# Patient Record
Sex: Female | Born: 1935 | Race: Black or African American | Hispanic: No | Marital: Married | Smoking: Former smoker
Health system: Southern US, Community
[De-identification: ages and names within clinical notes are randomized; demographics above are authoritative.]

## PROBLEM LIST (undated history)

## (undated) DIAGNOSIS — M1990 Unspecified osteoarthritis, unspecified site: Secondary | ICD-10-CM

## (undated) DIAGNOSIS — I10 Essential (primary) hypertension: Secondary | ICD-10-CM

## (undated) DIAGNOSIS — E785 Hyperlipidemia, unspecified: Secondary | ICD-10-CM

## (undated) HISTORY — DX: Essential (primary) hypertension: I10

## (undated) HISTORY — DX: Unspecified osteoarthritis, unspecified site: M19.90

## (undated) HISTORY — DX: Hyperlipidemia, unspecified: E78.5

---

## 1998-07-01 ENCOUNTER — Ambulatory Visit (HOSPITAL_COMMUNITY): Admission: RE | Admit: 1998-07-01 | Discharge: 1998-07-01 | Payer: Self-pay | Admitting: Family Medicine

## 1998-07-01 ENCOUNTER — Encounter: Payer: Self-pay | Admitting: Family Medicine

## 1999-01-05 ENCOUNTER — Ambulatory Visit (HOSPITAL_COMMUNITY): Admission: RE | Admit: 1999-01-05 | Discharge: 1999-01-05 | Payer: Self-pay | Admitting: Family Medicine

## 1999-01-05 ENCOUNTER — Encounter: Payer: Self-pay | Admitting: Family Medicine

## 1999-08-24 ENCOUNTER — Other Ambulatory Visit: Admission: RE | Admit: 1999-08-24 | Discharge: 1999-08-24 | Payer: Self-pay | Admitting: Family Medicine

## 2001-07-14 ENCOUNTER — Ambulatory Visit (HOSPITAL_BASED_OUTPATIENT_CLINIC_OR_DEPARTMENT_OTHER): Admission: RE | Admit: 2001-07-14 | Discharge: 2001-07-14 | Payer: Self-pay | Admitting: General Surgery

## 2001-07-14 ENCOUNTER — Encounter (INDEPENDENT_AMBULATORY_CARE_PROVIDER_SITE_OTHER): Payer: Self-pay | Admitting: *Deleted

## 2008-08-09 ENCOUNTER — Ambulatory Visit: Payer: Self-pay | Admitting: Family Medicine

## 2008-08-16 ENCOUNTER — Ambulatory Visit: Payer: Self-pay | Admitting: Family Medicine

## 2008-09-16 ENCOUNTER — Encounter: Admission: RE | Admit: 2008-09-16 | Discharge: 2008-09-16 | Payer: Self-pay | Admitting: Family Medicine

## 2008-09-22 ENCOUNTER — Ambulatory Visit: Payer: Self-pay | Admitting: Family Medicine

## 2008-09-23 ENCOUNTER — Telehealth: Payer: Self-pay | Admitting: Gastroenterology

## 2008-09-29 DIAGNOSIS — R6881 Early satiety: Secondary | ICD-10-CM

## 2008-09-29 DIAGNOSIS — R634 Abnormal weight loss: Secondary | ICD-10-CM

## 2008-09-29 DIAGNOSIS — R630 Anorexia: Secondary | ICD-10-CM | POA: Insufficient documentation

## 2008-09-29 DIAGNOSIS — D649 Anemia, unspecified: Secondary | ICD-10-CM | POA: Insufficient documentation

## 2008-10-26 ENCOUNTER — Ambulatory Visit: Payer: Self-pay | Admitting: Family Medicine

## 2008-10-26 ENCOUNTER — Encounter: Payer: Self-pay | Admitting: Gastroenterology

## 2008-11-05 ENCOUNTER — Observation Stay (HOSPITAL_COMMUNITY): Admission: EM | Admit: 2008-11-05 | Discharge: 2008-11-06 | Payer: Self-pay | Admitting: Emergency Medicine

## 2009-05-12 ENCOUNTER — Ambulatory Visit: Payer: Self-pay | Admitting: Family Medicine

## 2009-07-27 ENCOUNTER — Ambulatory Visit: Payer: Self-pay | Admitting: Family Medicine

## 2009-09-05 ENCOUNTER — Ambulatory Visit: Payer: Self-pay | Admitting: Family Medicine

## 2009-10-06 ENCOUNTER — Ambulatory Visit: Payer: Self-pay | Admitting: Family Medicine

## 2009-11-08 ENCOUNTER — Ambulatory Visit: Payer: Self-pay | Admitting: Family Medicine

## 2009-11-29 ENCOUNTER — Ambulatory Visit: Payer: Self-pay | Admitting: Family Medicine

## 2010-01-09 ENCOUNTER — Ambulatory Visit: Payer: Self-pay | Admitting: Family Medicine

## 2010-04-13 ENCOUNTER — Ambulatory Visit: Payer: Self-pay | Admitting: Family Medicine

## 2010-05-11 ENCOUNTER — Ambulatory Visit: Payer: Self-pay | Admitting: Family Medicine

## 2010-10-07 ENCOUNTER — Encounter: Payer: Self-pay | Admitting: Psychiatry

## 2010-11-17 ENCOUNTER — Other Ambulatory Visit (HOSPITAL_COMMUNITY): Payer: Self-pay | Admitting: Psychiatry

## 2010-11-17 DIAGNOSIS — R410 Disorientation, unspecified: Secondary | ICD-10-CM

## 2010-11-17 DIAGNOSIS — R413 Other amnesia: Secondary | ICD-10-CM

## 2010-12-25 ENCOUNTER — Ambulatory Visit (HOSPITAL_COMMUNITY)
Admission: RE | Admit: 2010-12-25 | Discharge: 2010-12-25 | Disposition: A | Discharge status: Home / Self Care | Payer: Medicare Other | Source: Ambulatory Visit | Attending: Psychiatry | Admitting: Psychiatry

## 2010-12-25 DIAGNOSIS — G319 Degenerative disease of nervous system, unspecified: Secondary | ICD-10-CM | POA: Insufficient documentation

## 2010-12-25 DIAGNOSIS — R410 Disorientation, unspecified: Secondary | ICD-10-CM

## 2010-12-25 DIAGNOSIS — F29 Unspecified psychosis not due to a substance or known physiological condition: Secondary | ICD-10-CM | POA: Insufficient documentation

## 2010-12-25 DIAGNOSIS — R413 Other amnesia: Secondary | ICD-10-CM | POA: Insufficient documentation

## 2010-12-25 DIAGNOSIS — I6789 Other cerebrovascular disease: Secondary | ICD-10-CM | POA: Insufficient documentation

## 2011-01-02 LAB — CBC
HCT: 29.9 % — ABNORMAL LOW (ref 36.0–46.0)
Hemoglobin: 10.2 g/dL — ABNORMAL LOW (ref 12.0–15.0)
MCHC: 34 g/dL (ref 30.0–36.0)
MCV: 89.1 fL (ref 78.0–100.0)
Platelets: 212 10*3/uL (ref 150–400)
RBC: 3.36 MIL/uL — ABNORMAL LOW (ref 3.87–5.11)
RDW: 17.6 % — ABNORMAL HIGH (ref 11.5–15.5)
WBC: 5.3 10*3/uL (ref 4.0–10.5)

## 2011-01-02 LAB — URINE MICROSCOPIC-ADD ON

## 2011-01-02 LAB — DIFFERENTIAL
Lymphocytes Relative: 21 % (ref 12–46)
Lymphs Abs: 1.1 10*3/uL (ref 0.7–4.0)
Monocytes Absolute: 0.6 10*3/uL (ref 0.1–1.0)
Monocytes Relative: 12 % (ref 3–12)
Neutro Abs: 3.6 10*3/uL (ref 1.7–7.7)
Neutrophils Relative %: 67 % (ref 43–77)

## 2011-01-02 LAB — URINALYSIS, ROUTINE W REFLEX MICROSCOPIC
Bilirubin Urine: NEGATIVE
Hgb urine dipstick: NEGATIVE
Specific Gravity, Urine: 1.007 (ref 1.005–1.030)
pH: 7 (ref 5.0–8.0)

## 2011-01-02 LAB — GLUCOSE, CAPILLARY
Glucose-Capillary: 121 mg/dL — ABNORMAL HIGH (ref 70–99)
Glucose-Capillary: 142 mg/dL — ABNORMAL HIGH (ref 70–99)
Glucose-Capillary: 153 mg/dL — ABNORMAL HIGH (ref 70–99)
Glucose-Capillary: 252 mg/dL — ABNORMAL HIGH (ref 70–99)

## 2011-01-02 LAB — URINE CULTURE: Colony Count: 50000

## 2011-01-02 LAB — COMPREHENSIVE METABOLIC PANEL
Albumin: 3.2 g/dL — ABNORMAL LOW (ref 3.5–5.2)
BUN: 9 mg/dL (ref 6–23)
Calcium: 8.8 mg/dL (ref 8.4–10.5)
Creatinine, Ser: 0.77 mg/dL (ref 0.4–1.2)
Glucose, Bld: 144 mg/dL — ABNORMAL HIGH (ref 70–99)
Potassium: 3 mEq/L — ABNORMAL LOW (ref 3.5–5.1)
Total Protein: 6.2 g/dL (ref 6.0–8.3)

## 2011-02-02 NOTE — Op Note (Signed)
[place]. [facility]  Patient:    ADER, FRITZE Visit Number: [id number] MRN: [id number]          Service Type: DSU Location: [facility] Attending Physician:  Janalyn Rouse Dictated by:   Tricarico Phi. Maple Hudson, M.D. Proc. Date: 07/14/01 Admit Date:  07/14/2001   CC:         Jeralyn Ruths, M.D.   Operative Report  PREOPERATIVE DIAGNOSIS:  Intraductal papilloma of the left breast.  POSTOPERATIVE DIAGNOSIS:  Intraductal papilloma of the left breast.  OPERATION PERFORMED:  Excision of duct system of the left breast.  SURGEON:  Heckart Phi. Maple Hudson, M.D.  ANESTHESIA:  MAC.  INDICATIONS FOR PROCEDURE:  This patient had had a draining from the 8 and 6 oclock position and had ductograms done which showed an intraductal papilloma and the duct coming to the 8 oclock position of the left breast and ductal ectasia going to the 6 oclock position.  We are to excise those.  DESCRIPTION OF PROCEDURE:  The patient was placed on the operating table with the left arm extended.  The left breast was prepped and draped in the usual fashion.  A circumareolar incision centered on about the 7 oclock position was then outlined on the left breast and the area infiltrated with 1% Xylocaine with Adrenalin.  An incision was made and then I dissected underneath that time the areola up under the nipple and then took a large wedge of tissue incorporating the 6 to 8 oclock positions.  Hemostasis was obtained with th4e cautery.  Subcuticular closure of 4-0 Monocryl and Steri-Strips carried out.  Dressing applied.  The patient was transferred to the recovery room in satisfactory condition having tolerated the procedure well. Dictated by:   [place]on Phi. Maple Hudson, M.D. Attending Physician:  Janalyn Rouse DD:  07/14/01 TD:  07/14/01 Job: [id number]

## 2011-06-28 ENCOUNTER — Ambulatory Visit (INDEPENDENT_AMBULATORY_CARE_PROVIDER_SITE_OTHER): Payer: Medicare Other | Admitting: Family Medicine

## 2011-06-28 ENCOUNTER — Encounter: Payer: Self-pay | Admitting: Family Medicine

## 2011-06-28 DIAGNOSIS — Z79899 Other long term (current) drug therapy: Secondary | ICD-10-CM

## 2011-06-28 DIAGNOSIS — Z23 Encounter for immunization: Secondary | ICD-10-CM

## 2011-06-28 DIAGNOSIS — E1169 Type 2 diabetes mellitus with other specified complication: Secondary | ICD-10-CM

## 2011-06-28 DIAGNOSIS — I10 Essential (primary) hypertension: Secondary | ICD-10-CM

## 2011-06-28 DIAGNOSIS — E119 Type 2 diabetes mellitus without complications: Secondary | ICD-10-CM

## 2011-06-28 DIAGNOSIS — R292 Abnormal reflex: Secondary | ICD-10-CM

## 2011-06-28 DIAGNOSIS — E785 Hyperlipidemia, unspecified: Secondary | ICD-10-CM

## 2011-06-28 DIAGNOSIS — F0280 Dementia in other diseases classified elsewhere without behavioral disturbance: Secondary | ICD-10-CM

## 2011-06-28 LAB — LIPID PANEL
HDL: 72 mg/dL (ref >39)
LDL Cholesterol: 162 mg/dL — ABNORMAL HIGH (ref 0–99)

## 2011-06-28 LAB — POCT UA - MICROALBUMIN
Albumin/Creatinine Ratio, Urine, POC: 17.2
Creatinine, POC: 162 mg/dL
Microalbumin Ur, POC: 27.8 mg/dL

## 2011-06-28 LAB — CBC WITH DIFFERENTIAL/PLATELET
Basophils Absolute: 0 10*3/uL (ref 0.0–0.1)
Basophils Relative: 1 % (ref 0–1)
HCT: 35.7 % — ABNORMAL LOW (ref 36.0–46.0)
Hemoglobin: 11.7 g/dL — ABNORMAL LOW (ref 12.0–15.0)
Lymphocytes Relative: 50 % — ABNORMAL HIGH (ref 12–46)
MCHC: 32.8 g/dL (ref 30.0–36.0)
Monocytes Absolute: 0.4 10*3/uL (ref 0.1–1.0)
Neutro Abs: 1.1 10*3/uL — ABNORMAL LOW (ref 1.7–7.7)
Neutrophils Relative %: 35 % — ABNORMAL LOW (ref 43–77)
RDW: 15.8 % — ABNORMAL HIGH (ref 11.5–15.5)
WBC: 3.1 10*3/uL — ABNORMAL LOW (ref 4.0–10.5)

## 2011-06-28 LAB — COMPREHENSIVE METABOLIC PANEL
ALT: 9 U/L (ref 0–35)
AST: 17 U/L (ref 0–37)
Albumin: 4.2 g/dL (ref 3.5–5.2)
Alkaline Phosphatase: 63 U/L (ref 39–117)
Calcium: 9 mg/dL (ref 8.4–10.5)
Chloride: 103 mEq/L (ref 96–112)
Potassium: 3.8 mEq/L (ref 3.5–5.3)
Sodium: 142 mEq/L (ref 135–145)
Total Protein: 6.9 g/dL (ref 6.0–8.3)

## 2011-06-28 LAB — TSH: TSH: 1.194 u[IU]/mL (ref 0.350–4.500)

## 2011-06-28 LAB — POCT GLYCOSYLATED HEMOGLOBIN (HGB A1C): Hemoglobin A1C: 5.9

## 2011-06-28 MED ORDER — LISINOPRIL-HYDROCHLOROTHIAZIDE 20-12.5 MG PO TABS: 1.0000 | ORAL_TABLET | Freq: Every day | ORAL | Status: DC

## 2011-06-28 MED ORDER — METFORMIN HCL 850 MG PO TABS: 850.0000 mg | ORAL_TABLET | Freq: Two times a day (BID) | ORAL | Status: DC

## 2011-06-28 NOTE — Progress Notes (Signed)
  Subjective:    Patient ID: Beth Kelly, female    DOB: 05-21-1934, 77 y.o.   MRN: [id number]  HPI She is here in one year absence. She is on metformin and lisinopril however she is not taking her Pravachol. Also apparently she saw neurologist and was placed on an unknown medication and presently is using the patch she finds quite irritating. She lives alone. Her son is to keep in touch with her fairly frequently.   Review of Systems     Objective:   Physical Exam Alert and in no distress. Cardiac exam shows regular rhythm without murmurs or gallops. Lungs are clear to auscultation. Foot exam shows decreased pulses with lack of MMSE 27      Assessment & Plan:   1. Diabetes mellitus  metFORMIN (GLUCOPHAGE) 850 MG tablet, Ambulatory referral to Ophthalmology, CBC with Differential, Comprehensive metabolic panel, Lipid panel, POCT UA - Microalbumin, POCT HgB A1C  2. Hypertension associated with diabetes  lisinopril-hydrochlorothiazide (ZESTORETIC) 20-12.5 MG per tablet  3. Hyperlipidemia LDL goal <70    4. Alzheimer's disease    5. Encounter for long-term (current) use of other medications  CBC with Differential, Comprehensive metabolic panel, Lipid panel  6. Hyperreflexia  TSH   she will sign a release form to find out what medication she was on from the neurologist. Her medications were called in. She'll return here in one month for recheck on her blood pressure.

## 2011-06-28 NOTE — Patient Instructions (Signed)
Take all 4 of your medications including the patch until I get your records and I will call you. Come back here in one month for recheck on your blood pressure.

## 2011-06-29 ENCOUNTER — Other Ambulatory Visit: Payer: Self-pay

## 2011-06-29 ENCOUNTER — Telehealth: Payer: Self-pay

## 2011-06-29 MED ORDER — PRAVASTATIN SODIUM 20 MG PO TABS: 20.0000 mg | ORAL_TABLET | Freq: Every evening | ORAL | Status: DC

## 2011-06-29 NOTE — Telephone Encounter (Signed)
Pt son Chrissie Noa Plocher called and said he was told you wanted to talk with him and also he would like to know more about her visit yesterday his telephone # is [phone]

## 2011-07-30 ENCOUNTER — Encounter: Payer: Self-pay | Admitting: Family Medicine

## 2011-07-31 ENCOUNTER — Ambulatory Visit (INDEPENDENT_AMBULATORY_CARE_PROVIDER_SITE_OTHER): Payer: Medicare Other | Admitting: Family Medicine

## 2011-07-31 ENCOUNTER — Encounter: Payer: Self-pay | Admitting: Family Medicine

## 2011-07-31 DIAGNOSIS — E785 Hyperlipidemia, unspecified: Secondary | ICD-10-CM

## 2011-07-31 DIAGNOSIS — E1169 Type 2 diabetes mellitus with other specified complication: Secondary | ICD-10-CM

## 2011-07-31 DIAGNOSIS — I10 Essential (primary) hypertension: Secondary | ICD-10-CM

## 2011-07-31 DIAGNOSIS — Z9119 Patient's noncompliance with other medical treatment and regimen: Secondary | ICD-10-CM

## 2011-07-31 DIAGNOSIS — E119 Type 2 diabetes mellitus without complications: Secondary | ICD-10-CM

## 2011-07-31 DIAGNOSIS — E1159 Type 2 diabetes mellitus with other circulatory complications: Secondary | ICD-10-CM

## 2011-07-31 DIAGNOSIS — Z91199 Patient's noncompliance with other medical treatment and regimen due to unspecified reason: Secondary | ICD-10-CM

## 2011-07-31 NOTE — Patient Instructions (Signed)
You're supposed to be on the Pravachol(pravastatin) and the lisinopril as well as Glucophage(metformin). If you have trouble with getting sick, stopped on the medicines and see if the sickness goes away and then let me know. Take all 3 of the medicines.

## 2011-07-31 NOTE — Progress Notes (Signed)
  Subjective:    Patient ID: Beth Kelly, female    DOB: [birth year], 77 y.o.   MRN: [id number]  HPI She is here for a recheck. She states that she stopped taking 2 of the 3 medications however when I quiz her concerning this she cannot give me a good answer as to why except that one of them made her sick.   Review of Systems     Objective:   Physical Exam Alert and in no distress. Blood pressure is recorded.       Assessment & Plan:  Hypertension. Diabetes. Recommend she take all 3 of the medications again and let me know which one of them does indeed cause sickness and I will change it. Otherwise I will see her in 2 months.

## 2011-10-02 ENCOUNTER — Encounter: Payer: Self-pay | Admitting: Family Medicine

## 2011-10-02 ENCOUNTER — Ambulatory Visit (INDEPENDENT_AMBULATORY_CARE_PROVIDER_SITE_OTHER): Payer: Medicare Other | Admitting: Family Medicine

## 2011-10-02 DIAGNOSIS — E119 Type 2 diabetes mellitus without complications: Secondary | ICD-10-CM

## 2011-10-02 DIAGNOSIS — I10 Essential (primary) hypertension: Secondary | ICD-10-CM

## 2011-10-02 DIAGNOSIS — E1169 Type 2 diabetes mellitus with other specified complication: Secondary | ICD-10-CM

## 2011-10-02 DIAGNOSIS — E785 Hyperlipidemia, unspecified: Secondary | ICD-10-CM | POA: Diagnosis not present

## 2011-10-02 DIAGNOSIS — E1159 Type 2 diabetes mellitus with other circulatory complications: Secondary | ICD-10-CM

## 2011-10-02 LAB — POCT GLYCOSYLATED HEMOGLOBIN (HGB A1C): Hemoglobin A1C: 6.1

## 2011-10-02 MED ORDER — ATORVASTATIN CALCIUM 10 MG PO TABS: 10.0000 mg | ORAL_TABLET | Freq: Every day | ORAL | Status: DC

## 2011-10-02 MED ORDER — AMLODIPINE BESYLATE 5 MG PO TABS: 5.0000 mg | ORAL_TABLET | Freq: Every day | ORAL | Status: DC

## 2011-10-02 MED ORDER — METFORMIN HCL 850 MG PO TABS: 850.0000 mg | ORAL_TABLET | Freq: Two times a day (BID) | ORAL | Status: DC

## 2011-10-02 NOTE — Progress Notes (Signed)
  Subjective:    Patient ID: Beth Kelly, female    DOB: 09-24-1933, 78 y.o.   MRN: [id number]  HPI She is here for a recheck. She continues to have difficulty with her weight. She has had an extensive workup with this in the past including GI which was negative. She has had some difficulty with her cholesterol med causing some unacceptable side effects. Otherwise she has no concerns or complaints. She is relatively sedentary, does not smoke or drink. He does the sella or medications renewed.   Review of Systems     Objective:   Physical Exam Alert and in no distress. Hemoglobin A1c is 6.1      Assessment & Plan:   1. Diabetes mellitus  metFORMIN (GLUCOPHAGE) 850 MG tablet, POCT HgB A1C  2. Hypertension associated with diabetes    3. Hyperlipidemia LDL goal <70     I will switch her to Lipitor which is covered under her insurance. We knew several of her other medications. Recheck her in several months. Also encouraged her to add dietary supplements like Ensure to her daily regimen.

## 2011-10-02 NOTE — Patient Instructions (Signed)
Start on a new cholesterol medicine and a new blood pressure medicine as well as the other ones you are still on and I'll see you back here in a couple months. Basically at the nutritional supplements and drinks like Ensure

## 2011-11-30 DIAGNOSIS — F0390 Unspecified dementia without behavioral disturbance: Secondary | ICD-10-CM | POA: Diagnosis not present

## 2012-01-22 ENCOUNTER — Encounter: Payer: Self-pay | Admitting: Internal Medicine

## 2012-01-28 ENCOUNTER — Ambulatory Visit: Payer: Medicare Other | Admitting: Family Medicine

## 2012-01-30 ENCOUNTER — Ambulatory Visit: Payer: Medicare Other | Admitting: Family Medicine

## 2012-01-31 ENCOUNTER — Encounter: Payer: Self-pay | Admitting: Family Medicine

## 2012-01-31 ENCOUNTER — Ambulatory Visit (INDEPENDENT_AMBULATORY_CARE_PROVIDER_SITE_OTHER): Payer: Medicare Other | Admitting: Family Medicine

## 2012-01-31 DIAGNOSIS — E785 Hyperlipidemia, unspecified: Secondary | ICD-10-CM

## 2012-01-31 DIAGNOSIS — E1169 Type 2 diabetes mellitus with other specified complication: Secondary | ICD-10-CM | POA: Diagnosis not present

## 2012-01-31 DIAGNOSIS — E119 Type 2 diabetes mellitus without complications: Secondary | ICD-10-CM | POA: Diagnosis not present

## 2012-01-31 DIAGNOSIS — Z23 Encounter for immunization: Secondary | ICD-10-CM

## 2012-01-31 DIAGNOSIS — E1159 Type 2 diabetes mellitus with other circulatory complications: Secondary | ICD-10-CM

## 2012-01-31 DIAGNOSIS — Z79899 Other long term (current) drug therapy: Secondary | ICD-10-CM | POA: Diagnosis not present

## 2012-01-31 DIAGNOSIS — I10 Essential (primary) hypertension: Secondary | ICD-10-CM | POA: Diagnosis not present

## 2012-01-31 DIAGNOSIS — R609 Edema, unspecified: Secondary | ICD-10-CM

## 2012-01-31 LAB — COMPREHENSIVE METABOLIC PANEL
Albumin: 3.9 g/dL (ref 3.5–5.2)
BUN: 13 mg/dL (ref 6–23)
CO2: 28 mEq/L (ref 19–32)
Glucose, Bld: 92 mg/dL (ref 70–99)
Potassium: 3.8 mEq/L (ref 3.5–5.3)
Sodium: 139 mEq/L (ref 135–145)
Total Bilirubin: 0.6 mg/dL (ref 0.3–1.2)
Total Protein: 6.7 g/dL (ref 6.0–8.3)

## 2012-01-31 LAB — LIPID PANEL
Cholesterol: 187 mg/dL (ref 0–200)
Triglycerides: 45 mg/dL (ref <150)

## 2012-01-31 NOTE — Progress Notes (Signed)
  Subjective:    Patient ID: Beth Kelly, female    DOB: [birth year], 78 y.o.   MRN: [id number]  HPI She is here for a general checkup. She does have underlying diabetes. She also is having leg and foot swelling but cannot say for how long. She's had no chest pain, shortness of breath, PND, weakness or dizziness. She lives alone and her children do check on her. She is here with a good friend. There is question of medications that she is on and multiple bottles of medicines were brought in and reviewed. Several of them were several years old and some did not even belong to her.   Review of Systems     Objective:   Physical Exam Alert and in no distress. Cardiac exam shows regular rhythm without murmurs gallops. Lungs are clear to auscultation. Lower extremity exam does show 1-2+ pitting edema. Homan's sign is negative. Hb A1c is 5.8.      Assessment & Plan:   1. Diabetes mellitus without mention of complication  POCT HgB A1C, CBC with Differential, Comprehensive metabolic panel, Lipid panel  2. Hyperlipidemia LDL goal <70  Lipid panel  3. Hypertension associated with diabetes  Comprehensive metabolic panel  4. Diabetes mellitus  Comprehensive metabolic panel  5. Dependent edema  Comprehensive metabolic panel  6. Encounter for long-term (current) use of other medications  CBC with Differential, Comprehensive metabolic panel, Lipid panel, Tdap vaccine greater than or equal to 7yo IM   encouraged her to keep her feet up as much as possible when she is not walking around. We will also have [facility] come to evaluate her needs. She does have some memory issues but seems to do well on an MMSE. Her medications were reviewed and the appropriate medications were written down to ensure accuracy.

## 2012-01-31 NOTE — Patient Instructions (Signed)
You need to take the lisinopril, metformin, amlodipine, pravastatin as well as one baby aspirin. When you sit keep your feet elevated and wear support stockings

## 2012-02-01 LAB — CBC WITH DIFFERENTIAL/PLATELET
Eosinophils Absolute: 0 10*3/uL (ref 0.0–0.7)
HCT: 35.1 % — ABNORMAL LOW (ref 36.0–46.0)
Hemoglobin: 11.3 g/dL — ABNORMAL LOW (ref 12.0–15.0)
Lymphs Abs: 0.9 10*3/uL (ref 0.7–4.0)
MCH: 28.6 pg (ref 26.0–34.0)
Monocytes Absolute: 0.8 10*3/uL (ref 0.1–1.0)
Monocytes Relative: 18 % — ABNORMAL HIGH (ref 3–12)
Neutrophils Relative %: 62 % (ref 43–77)
RBC: 3.95 MIL/uL (ref 3.87–5.11)

## 2012-03-10 DIAGNOSIS — F22 Delusional disorders: Secondary | ICD-10-CM | POA: Diagnosis not present

## 2012-03-17 DIAGNOSIS — I739 Peripheral vascular disease, unspecified: Secondary | ICD-10-CM | POA: Diagnosis not present

## 2012-03-17 DIAGNOSIS — L608 Other nail disorders: Secondary | ICD-10-CM | POA: Diagnosis not present

## 2012-03-17 DIAGNOSIS — L84 Corns and callosities: Secondary | ICD-10-CM | POA: Diagnosis not present

## 2012-03-17 DIAGNOSIS — E1159 Type 2 diabetes mellitus with other circulatory complications: Secondary | ICD-10-CM | POA: Diagnosis not present

## 2012-04-17 ENCOUNTER — Telehealth: Payer: Self-pay | Admitting: Internal Medicine

## 2012-04-18 ENCOUNTER — Telehealth: Payer: Self-pay

## 2012-04-18 NOTE — Telephone Encounter (Signed)
Called the [facility] network talked with Beth Kelly she said we had referred her in may they have tried twice to call and get in contact but they are going to call Beth Kelly and explain every thing to him He has called me and has been informed of what is going on he will wait for there call he said she is paranoid that someone is trying to kick her out of her home so he would like to be there when they come in and ess.

## 2012-04-18 NOTE — Telephone Encounter (Signed)
Call [facility] and find out what they've done to evaluate her home situation. Call her son and let him know per

## 2012-06-05 ENCOUNTER — Encounter: Payer: Self-pay | Admitting: Family Medicine

## 2012-06-05 ENCOUNTER — Ambulatory Visit (INDEPENDENT_AMBULATORY_CARE_PROVIDER_SITE_OTHER): Payer: Medicare Other | Admitting: Family Medicine

## 2012-06-05 VITALS — BP 160/80 | HR 69 | Ht 66.0 in | Wt 124.0 lb

## 2012-06-05 DIAGNOSIS — E785 Hyperlipidemia, unspecified: Secondary | ICD-10-CM | POA: Diagnosis not present

## 2012-06-05 DIAGNOSIS — E1169 Type 2 diabetes mellitus with other specified complication: Secondary | ICD-10-CM | POA: Diagnosis not present

## 2012-06-05 DIAGNOSIS — E119 Type 2 diabetes mellitus without complications: Secondary | ICD-10-CM

## 2012-06-05 DIAGNOSIS — I10 Essential (primary) hypertension: Secondary | ICD-10-CM | POA: Diagnosis not present

## 2012-06-05 DIAGNOSIS — Z23 Encounter for immunization: Secondary | ICD-10-CM | POA: Diagnosis not present

## 2012-06-05 LAB — POCT UA - MICROALBUMIN
Albumin/Creatinine Ratio, Urine, POC: 22.3
Creatinine, POC: 24.7 mg/dL

## 2012-06-05 MED ORDER — AMLODIPINE BESYLATE 10 MG PO TABS: 10.0000 mg | ORAL_TABLET | Freq: Every day | ORAL | Status: DC

## 2012-06-05 MED ORDER — SIMVASTATIN 20 MG PO TABS: 20.0000 mg | ORAL_TABLET | Freq: Every day | ORAL | Status: DC

## 2012-06-05 MED ORDER — INFLUENZA VIRUS VACC SPLIT PF IM SUSP: 0.5000 mL | Freq: Once | INTRAMUSCULAR | Status: DC

## 2012-06-05 NOTE — Progress Notes (Signed)
  Subjective:    Patient ID: Beth Kelly, female    DOB: 09-15-1934, 78 y.o.   MRN: [id number]  HPI He is here for checkup. She does not check her blood sugars regularly. She usually has it checked on Friday when she goes to church. She does not smoke or drink and her physical activity is minimal. She does eat intermittently but has gained some weight. She has no particular concerns . She did stop taking the Lipitor stating it made her feel bad. She has been seen over the last year by ophthalmology. She will be seeing a foot specialist next week.   Review of Systems     Objective:   Physical Exam Alert and in no distress. Hemoglobin A1c is 5.9.       Assessment & Plan:   1. Diabetes mellitus  POCT glycosylated hemoglobin (Hb A1C), POCT UA - Microalbumin  2. Need for prophylactic vaccination and inoculation against influenza  influenza  inactive virus vaccine (FLUZONE/FLUARIX) injection 0.5 mL  3. Hypertension associated with diabetes    4. Hyperlipidemia LDL goal <70     prescription was also written to get a Zostavax shot. I explained how to do this. Flu shot information given concerning benefit and risks.

## 2012-06-05 NOTE — Patient Instructions (Signed)
Start taking the 10 mg amlodipine (Norvasc) and get rid of the 5 mg.

## 2012-06-09 DIAGNOSIS — E1159 Type 2 diabetes mellitus with other circulatory complications: Secondary | ICD-10-CM | POA: Diagnosis not present

## 2012-06-09 DIAGNOSIS — L84 Corns and callosities: Secondary | ICD-10-CM | POA: Diagnosis not present

## 2012-06-09 DIAGNOSIS — I739 Peripheral vascular disease, unspecified: Secondary | ICD-10-CM | POA: Diagnosis not present

## 2012-06-09 DIAGNOSIS — L608 Other nail disorders: Secondary | ICD-10-CM | POA: Diagnosis not present

## 2012-07-15 DIAGNOSIS — H25019 Cortical age-related cataract, unspecified eye: Secondary | ICD-10-CM | POA: Diagnosis not present

## 2012-07-15 DIAGNOSIS — E119 Type 2 diabetes mellitus without complications: Secondary | ICD-10-CM | POA: Diagnosis not present

## 2012-09-12 DIAGNOSIS — F0390 Unspecified dementia without behavioral disturbance: Secondary | ICD-10-CM | POA: Diagnosis not present

## 2012-09-12 DIAGNOSIS — F22 Delusional disorders: Secondary | ICD-10-CM | POA: Diagnosis not present

## 2012-10-06 ENCOUNTER — Ambulatory Visit: Payer: Medicare Other | Admitting: Family Medicine

## 2012-10-17 ENCOUNTER — Other Ambulatory Visit: Payer: Self-pay | Admitting: Family Medicine

## 2012-10-17 DIAGNOSIS — F0390 Unspecified dementia without behavioral disturbance: Secondary | ICD-10-CM | POA: Diagnosis not present

## 2012-11-03 ENCOUNTER — Ambulatory Visit: Payer: Medicare Other | Admitting: Family Medicine

## 2012-11-12 ENCOUNTER — Telehealth: Payer: Self-pay

## 2012-11-12 NOTE — Telephone Encounter (Signed)
Pt son called me this morning wanting to know why his mom is on 3 different memory med's Rx in the last 3 weeks.I told him of the med's she was taking that we had in the computer he was upset didn't understand why Dr.Lalonde didn't know what med's she was on he didn't know who else she was seeing so I pulled pt chart last note that we have is from 11/2010 from a Dr.Plovsky called his office left message for his med coordinator to call us or call son ray left our numbers called ray back informed him of what I found and gave him # and his mom was last seen here 05/2012 and she has canceled her last 2 apt

## 2012-11-13 ENCOUNTER — Telehealth: Payer: Self-pay | Admitting: Family Medicine

## 2012-11-14 NOTE — Telephone Encounter (Signed)
Have him set up an appt. She has not been in since Sept.

## 2012-11-14 NOTE — Telephone Encounter (Signed)
FAMILY COMING IN ON 3/3 @ 1:30 FOR APPT

## 2012-11-17 ENCOUNTER — Ambulatory Visit: Payer: Medicare Other | Admitting: Family Medicine

## 2012-11-18 ENCOUNTER — Ambulatory Visit (INDEPENDENT_AMBULATORY_CARE_PROVIDER_SITE_OTHER): Payer: Medicare Other | Admitting: Family Medicine

## 2012-11-19 ENCOUNTER — Ambulatory Visit (INDEPENDENT_AMBULATORY_CARE_PROVIDER_SITE_OTHER): Payer: Medicare Other | Admitting: Family Medicine

## 2012-11-19 ENCOUNTER — Ambulatory Visit: Payer: Medicare Other | Admitting: Family Medicine

## 2012-11-19 ENCOUNTER — Encounter: Payer: Self-pay | Admitting: Family Medicine

## 2012-11-19 VITALS — BP 150/92 | HR 66 | Wt 127.0 lb

## 2012-11-19 DIAGNOSIS — Z79899 Other long term (current) drug therapy: Secondary | ICD-10-CM

## 2012-11-19 DIAGNOSIS — Z91199 Patient's noncompliance with other medical treatment and regimen due to unspecified reason: Secondary | ICD-10-CM

## 2012-11-19 DIAGNOSIS — E1159 Type 2 diabetes mellitus with other circulatory complications: Secondary | ICD-10-CM

## 2012-11-19 DIAGNOSIS — E119 Type 2 diabetes mellitus without complications: Secondary | ICD-10-CM

## 2012-11-19 DIAGNOSIS — E785 Hyperlipidemia, unspecified: Secondary | ICD-10-CM

## 2012-11-19 LAB — CBC WITH DIFFERENTIAL/PLATELET
Basophils Absolute: 0 10*3/uL (ref 0.0–0.1)
HCT: 35 % — ABNORMAL LOW (ref 36.0–46.0)
Hemoglobin: 11.7 g/dL — ABNORMAL LOW (ref 12.0–15.0)
Lymphocytes Relative: 44 % (ref 12–46)
Lymphs Abs: 1.6 10*3/uL (ref 0.7–4.0)
Monocytes Absolute: 0.5 10*3/uL (ref 0.1–1.0)
Neutro Abs: 1.6 10*3/uL — ABNORMAL LOW (ref 1.7–7.7)
RBC: 4.1 MIL/uL (ref 3.87–5.11)
RDW: 15.3 % (ref 11.5–15.5)
WBC: 3.7 10*3/uL — ABNORMAL LOW (ref 4.0–10.5)

## 2012-11-19 LAB — COMPREHENSIVE METABOLIC PANEL
ALT: <8 U/L (ref 0–35)
AST: 16 U/L (ref 0–37)
Albumin: 4 g/dL (ref 3.5–5.2)
BUN: 11 mg/dL (ref 6–23)
Calcium: 9.4 mg/dL (ref 8.4–10.5)
Chloride: 103 mEq/L (ref 96–112)
Potassium: 3.9 mEq/L (ref 3.5–5.3)

## 2012-11-19 LAB — LIPID PANEL: LDL Cholesterol: 125 mg/dL — ABNORMAL HIGH (ref 0–99)

## 2012-11-19 MED ORDER — LISINOPRIL-HYDROCHLOROTHIAZIDE 20-12.5 MG PO TABS: 1.0000 | ORAL_TABLET | Freq: Every day | ORAL | Status: DC

## 2012-11-19 MED ORDER — AMLODIPINE BESYLATE 10 MG PO TABS: 10.0000 mg | ORAL_TABLET | Freq: Every day | ORAL | Status: DC

## 2012-11-19 MED ORDER — ATORVASTATIN CALCIUM 10 MG PO TABS: ORAL_TABLET | ORAL | Status: DC

## 2012-11-19 MED ORDER — METFORMIN HCL 850 MG PO TABS: ORAL_TABLET | ORAL | Status: DC

## 2012-11-19 NOTE — Progress Notes (Signed)
  Subjective:    Patient ID: Beth Kelly, female    DOB: 05-30-1934, 78 y.o.   MRN: [id number]  HPI She is here for a recheck. Her son, Simona Huh is here with her. He apparently is going to help with her caregiving from long distances he lives in [place]. She does not check her blood sugars. She does not eat regular meals. She intermittently takes her medications based whether she needs or not. Exercise is quite minimal. She does not smoke or drink. Her activities outside the house are quite limited. She has been seeing Dr. Donell Beers in the past due to some paranoid ideation as well as memory loss. She has not been taking the Aricept regularly. She also complains of dryness and cracking of several of her fingertips.   Review of Systems     Objective:   Physical Exam Alert and in no distress. Blood pressure and weight are recorded. Exam of her fingertips shows several of them to be thickened dry and cracked. Hemoglobin A1c is 7.0.      Assessment & Plan:  Diabetes mellitus - Plan: POCT glycosylated hemoglobin (Hb A1C), metFORMIN (GLUCOPHAGE) 850 MG tablet  Hyperlipidemia LDL goal <70 - Plan: Lipid panel, atorvastatin (LIPITOR) 10 MG tablet  Hypertension associated with diabetes - Plan: amLODipine (NORVASC) 10 MG tablet, lisinopril-hydrochlorothiazide (ZESTORETIC) 20-12.5 MG per tablet  Encounter for long-term (current) use of other medications - Plan: Lipid panel, CBC with Differential, Comprehensive metabolic panel  Personal history of noncompliance with medical treatment, presenting hazards to health I recommended cortisone cream for her fingertips. Also had a good discussion with her and her son concerning her care. I will get a THN involved to help with evaluating what her needs are. We will renew all her medications and he will make sure that she takes them regularly. Also discussed getting her more involved socially in various activities. She apparently still does drive although minimally.  Recheck here in 2 months.

## 2012-11-19 NOTE — Progress Notes (Signed)
  Subjective:    Patient ID: Beth Kelly, female    DOB: [birth year], 79 y.o.   MRN: [id number]  HPI    Review of Systems     Objective:   Physical Exam        Assessment & Plan:  Patient was seen on 11/19/2012

## 2012-11-20 ENCOUNTER — Telehealth: Payer: Self-pay | Admitting: Family Medicine

## 2012-11-20 NOTE — Progress Notes (Signed)
Quick Note:  Pt informed and mailed copy of results per pt request ______

## 2012-11-20 NOTE — Progress Notes (Signed)
Quick Note:  The labs look okay but that see how she does when she takes them regularly. ______

## 2012-11-20 NOTE — Telephone Encounter (Signed)
Tresa Garter called and wants to know if they are supposed to see Dr. Donell Beers?  And what is the status?  Simona Huh asked to speak with Dr. Susann Givens.  [place] [phone]

## 2012-11-20 NOTE — Telephone Encounter (Signed)
Faxed ov,ins,demo,and asked for home eval put note on fax sheet to please call me and let me know when you can go out Thank you

## 2012-11-24 ENCOUNTER — Ambulatory Visit: Payer: Medicare Other | Admitting: Family Medicine

## 2013-01-19 ENCOUNTER — Ambulatory Visit: Payer: Medicare Other | Admitting: Family Medicine

## 2013-02-13 ENCOUNTER — Encounter: Payer: Self-pay | Admitting: Family Medicine

## 2013-02-13 ENCOUNTER — Ambulatory Visit (INDEPENDENT_AMBULATORY_CARE_PROVIDER_SITE_OTHER): Payer: Medicare Other | Admitting: Family Medicine

## 2013-02-13 VITALS — BP 130/70 | HR 72 | Wt 118.0 lb

## 2013-02-13 DIAGNOSIS — E1159 Type 2 diabetes mellitus with other circulatory complications: Secondary | ICD-10-CM

## 2013-02-13 DIAGNOSIS — E785 Hyperlipidemia, unspecified: Secondary | ICD-10-CM

## 2013-02-13 DIAGNOSIS — E119 Type 2 diabetes mellitus without complications: Secondary | ICD-10-CM

## 2013-02-13 DIAGNOSIS — I10 Essential (primary) hypertension: Secondary | ICD-10-CM

## 2013-02-13 DIAGNOSIS — E1169 Type 2 diabetes mellitus with other specified complication: Secondary | ICD-10-CM | POA: Diagnosis not present

## 2013-02-13 DIAGNOSIS — Z9119 Patient's noncompliance with other medical treatment and regimen: Secondary | ICD-10-CM | POA: Diagnosis not present

## 2013-02-13 NOTE — Progress Notes (Signed)
  Subjective:    Patient ID: Beth Kelly, female    DOB: [birth year], 79 y.o.   MRN: [id number]  HPI She is here for recheck. She states that the memory medicine is making her have an upset stomach however further discussion with her indicates she got quite confused and then planed other medications. Her son is living with her and apparently she does not take any of her medicines on a regular basis except metformin. When asked about this she stated that they caused stomach trouble. She then admitted that she did not take them and was using this as an excuse.   Review of Systems     Objective:   Physical Exam Alert and in no distress otherwise not examined       Assessment & Plan:  Hyperlipidemia LDL goal <70  Hypertension associated with diabetes  Diabetes mellitus  Personal history of noncompliance with medical treatment, presenting hazards to health I explained this to her and the other lady who is the mother of one of his son's wives. She does come with her a lot. I explained that there is really nothing that I can do to make her take medication and can certainly followup but if she's not to take her medications there is no need for me to be involved.

## 2013-03-06 DIAGNOSIS — Z124 Encounter for screening for malignant neoplasm of cervix: Secondary | ICD-10-CM | POA: Diagnosis not present

## 2013-03-06 DIAGNOSIS — Z1231 Encounter for screening mammogram for malignant neoplasm of breast: Secondary | ICD-10-CM | POA: Diagnosis not present

## 2013-03-18 ENCOUNTER — Telehealth: Payer: Self-pay | Admitting: Family Medicine

## 2013-03-18 NOTE — Telephone Encounter (Signed)
Let her know that Beth Kelly to have foot problems before we can write for the shoes. Just having diabetes does not qualify

## 2013-03-18 NOTE — Telephone Encounter (Signed)
LEFT WORD FOR MESSAGE ON [phone] (Beth Kelly)

## 2013-03-18 NOTE — Telephone Encounter (Signed)
HER IN LAW STOPPED BY AND DROPPED OFF DIABETIC SHOE ORDER FORM. SHE STATES THAT YOU HAVE DISCUSSED THIS WITH MRS. Shepardson IN THE PAST AND SHE WASN'T INTERESTED. SHE HAS NOW CHANGED HER MIND. I AM SENDING BACK THE ORDER FORM IN YOUR FOLDER. PLEASE CALL VIRGINIA ROBERTS, THE PERSON WHO DROPPED THE FORM OFF, TO PICK UP. HER NUMBER IS [phone]

## 2013-07-07 ENCOUNTER — Encounter: Payer: Self-pay | Admitting: Family Medicine

## 2013-07-07 ENCOUNTER — Ambulatory Visit (INDEPENDENT_AMBULATORY_CARE_PROVIDER_SITE_OTHER): Payer: Medicare Other | Admitting: Family Medicine

## 2013-07-07 VITALS — BP 160/90 | HR 76 | Wt 124.0 lb

## 2013-07-07 DIAGNOSIS — E1169 Type 2 diabetes mellitus with other specified complication: Secondary | ICD-10-CM | POA: Diagnosis not present

## 2013-07-07 DIAGNOSIS — I10 Essential (primary) hypertension: Secondary | ICD-10-CM | POA: Diagnosis not present

## 2013-07-07 DIAGNOSIS — E119 Type 2 diabetes mellitus without complications: Secondary | ICD-10-CM | POA: Diagnosis not present

## 2013-07-07 DIAGNOSIS — Z23 Encounter for immunization: Secondary | ICD-10-CM

## 2013-07-07 DIAGNOSIS — E1159 Type 2 diabetes mellitus with other circulatory complications: Secondary | ICD-10-CM

## 2013-07-07 DIAGNOSIS — E785 Hyperlipidemia, unspecified: Secondary | ICD-10-CM

## 2013-07-07 DIAGNOSIS — I152 Hypertension secondary to endocrine disorders: Secondary | ICD-10-CM

## 2013-07-07 NOTE — Progress Notes (Signed)
Subjective:    Beth Kelly is a 79 y.o. female who presents for follow-up of Type 2 diabetes mellitus.    Home blood sugar records: no; she checks them once per week when she goes to church.  Current symptoms/problems none Daily foot checks: Any foot concerns: no Last eye exam:  08/16/13 groat   Medication compliance: Questionable. She states that she take some however her blood pressure is elevated. Current diet: none Current exercise: walking garden Known diabetic complications: none Cardiovascular risk factors: advanced age (older than 32 for men, 71 for women), diabetes mellitus, dyslipidemia and hypertension   The following portions of the patient's history were reviewed and updated as appropriate: allergies, current medications, past family history, past medical history, past social history and problem list. She does live at home and her children check on her regularly. There is a question of her forgetfulness especially concerning taking her medications. ROS as in subjective above    Objective:    BP 160/90  Pulse 76  Wt 124 lb (56.246 kg)  BMI 20.02 kg/m2  SpO2 97%  Filed Vitals:   07/07/13 0824  BP: 160/90  Pulse: 76    General appearence: alert, no distress, WD/WN  Lab Review Lab Results  Component Value Date   HGBA1C 7.0 11/19/2012   Lab Results  Component Value Date   CHOL 212* 11/19/2012   HDL 75 11/19/2012   LDLCALC 161* 11/19/2012   TRIG 62 11/19/2012   CHOLHDL 2.8 11/19/2012   No results found for this basenameConcepcion Elk     Chemistry      Component Value Date/Time   NA 142 11/19/2012 1445   K 3.9 11/19/2012 1445   CL 103 11/19/2012 1445   CO2 31 11/19/2012 1445   BUN 11 11/19/2012 1445   CREATININE 0.72 11/19/2012 1445   CREATININE 0.77 11/05/2008 1705      Component Value Date/Time   CALCIUM 9.4 11/19/2012 1445   ALKPHOS 85 11/19/2012 1445   AST 16 11/19/2012 1445   ALT <8 11/19/2012 1445   BILITOT 0.4 11/19/2012 1445        Chemistry       Component Value Date/Time   NA 142 11/19/2012 1445   K 3.9 11/19/2012 1445   CL 103 11/19/2012 1445   CO2 31 11/19/2012 1445   BUN 11 11/19/2012 1445   CREATININE 0.72 11/19/2012 1445   CREATININE 0.77 11/05/2008 1705      Component Value Date/Time   CALCIUM 9.4 11/19/2012 1445   ALKPHOS 85 11/19/2012 1445   AST 16 11/19/2012 1445   ALT <8 11/19/2012 1445   BILITOT 0.4 11/19/2012 1445    Hemoglobin A1c is 6.1       Assessment:   Encounter Diagnoses  Name Primary?  . Need for prophylactic vaccination and inoculation against influenza   . Diabetes mellitus Yes  . Hypertension associated with diabetes   . Hyperlipidemia LDL goal <70         Plan:    1.  Rx changes: none 2.  Education: Reviewed 'ABCs' of diabetes management (respective goals in parentheses):  A1C (<7), blood pressure (<130/80), and cholesterol (LDL <100). 3.  Compliance at present is estimated to be fair. Efforts to improve compliance (if necessary) will be directed at Encouraged her to check her blood sugars more regularly. 4. Follow up: 4 months  She does have a daily pill reminder but is not using it. I strongly encouraged her to use this regularly.  I will recheck her blood sugar with her next visit.

## 2013-07-17 DIAGNOSIS — H25019 Cortical age-related cataract, unspecified eye: Secondary | ICD-10-CM | POA: Diagnosis not present

## 2013-07-17 DIAGNOSIS — E119 Type 2 diabetes mellitus without complications: Secondary | ICD-10-CM | POA: Diagnosis not present

## 2013-07-17 LAB — HM DIABETES EYE EXAM

## 2013-08-05 ENCOUNTER — Encounter: Payer: Self-pay | Admitting: Family Medicine

## 2013-08-06 ENCOUNTER — Encounter: Payer: Self-pay | Admitting: Internal Medicine

## 2013-08-24 ENCOUNTER — Telehealth: Payer: Self-pay | Admitting: Internal Medicine

## 2013-08-24 NOTE — Telephone Encounter (Signed)
PT does NOT need a knee brace from [facility] in [place]

## 2013-11-20 DIAGNOSIS — E1159 Type 2 diabetes mellitus with other circulatory complications: Secondary | ICD-10-CM | POA: Diagnosis not present

## 2013-11-20 DIAGNOSIS — L84 Corns and callosities: Secondary | ICD-10-CM | POA: Diagnosis not present

## 2013-11-20 DIAGNOSIS — I739 Peripheral vascular disease, unspecified: Secondary | ICD-10-CM | POA: Diagnosis not present

## 2013-11-20 DIAGNOSIS — L608 Other nail disorders: Secondary | ICD-10-CM | POA: Diagnosis not present

## 2013-12-31 ENCOUNTER — Telehealth: Payer: Self-pay | Admitting: Family Medicine

## 2013-12-31 NOTE — Telephone Encounter (Signed)
[facility] for OA KNEE ORTHOSES and Suspension Sleeve.  I called pt she doesn't know anything about it.  Disregard request.

## 2014-01-05 ENCOUNTER — Encounter: Payer: Self-pay | Admitting: Family Medicine

## 2014-01-05 ENCOUNTER — Ambulatory Visit (INDEPENDENT_AMBULATORY_CARE_PROVIDER_SITE_OTHER): Payer: Medicare Other | Admitting: Family Medicine

## 2014-01-05 VITALS — BP 130/70 | HR 78 | Wt 123.0 lb

## 2014-01-05 DIAGNOSIS — D649 Anemia, unspecified: Secondary | ICD-10-CM

## 2014-01-05 DIAGNOSIS — E785 Hyperlipidemia, unspecified: Secondary | ICD-10-CM

## 2014-01-05 DIAGNOSIS — I10 Essential (primary) hypertension: Secondary | ICD-10-CM

## 2014-01-05 DIAGNOSIS — E119 Type 2 diabetes mellitus without complications: Secondary | ICD-10-CM | POA: Diagnosis not present

## 2014-01-05 DIAGNOSIS — E1169 Type 2 diabetes mellitus with other specified complication: Secondary | ICD-10-CM | POA: Diagnosis not present

## 2014-01-05 DIAGNOSIS — I152 Hypertension secondary to endocrine disorders: Secondary | ICD-10-CM

## 2014-01-05 DIAGNOSIS — E1159 Type 2 diabetes mellitus with other circulatory complications: Secondary | ICD-10-CM

## 2014-01-05 LAB — CBC WITH DIFFERENTIAL/PLATELET
BASOS ABS: 0 10*3/uL (ref 0.0–0.1)
Basophils Relative: 1 % (ref 0–1)
Eosinophils Absolute: 0 10*3/uL (ref 0.0–0.7)
Eosinophils Relative: 1 % (ref 0–5)
HEMATOCRIT: 33.5 % — AB (ref 36.0–46.0)
HEMOGLOBIN: 11.3 g/dL — AB (ref 12.0–15.0)
Lymphocytes Relative: 38 % (ref 12–46)
Lymphs Abs: 1.4 10*3/uL (ref 0.7–4.0)
MCH: 28.8 pg (ref 26.0–34.0)
MCHC: 33.7 g/dL (ref 30.0–36.0)
MCV: 85.2 fL (ref 78.0–100.0)
MONO ABS: 0.5 10*3/uL (ref 0.1–1.0)
Monocytes Relative: 15 % — ABNORMAL HIGH (ref 3–12)
NEUTROS ABS: 1.6 10*3/uL — AB (ref 1.7–7.7)
Neutrophils Relative %: 45 % (ref 43–77)
Platelets: 224 10*3/uL (ref 150–400)
RBC: 3.93 MIL/uL (ref 3.87–5.11)
RDW: 14.9 % (ref 11.5–15.5)
WBC: 3.6 10*3/uL — ABNORMAL LOW (ref 4.0–10.5)

## 2014-01-05 LAB — COMPREHENSIVE METABOLIC PANEL
ALBUMIN: 3.8 g/dL (ref 3.5–5.2)
ALT: <8 U/L (ref 0–35)
AST: 18 U/L (ref 0–37)
Alkaline Phosphatase: 71 U/L (ref 39–117)
BUN: 11 mg/dL (ref 6–23)
CO2: 32 mEq/L (ref 19–32)
Calcium: 8.8 mg/dL (ref 8.4–10.5)
Chloride: 103 mEq/L (ref 96–112)
Creat: 0.63 mg/dL (ref 0.50–1.10)
GLUCOSE: 84 mg/dL (ref 70–99)
POTASSIUM: 3.7 meq/L (ref 3.5–5.3)
SODIUM: 142 meq/L (ref 135–145)
TOTAL PROTEIN: 6.4 g/dL (ref 6.0–8.3)
Total Bilirubin: 0.5 mg/dL (ref 0.2–1.2)

## 2014-01-05 LAB — POCT UA - MICROALBUMIN
ALBUMIN/CREATININE RATIO, URINE, POC: 10
Creatinine, POC: 62.2 mg/dL
MICROALBUMIN (UR) POC: 6.2 mg/L

## 2014-01-05 LAB — LIPID PANEL
Cholesterol: 227 mg/dL — ABNORMAL HIGH (ref 0–200)
HDL: 72 mg/dL (ref >39)
LDL CALC: 144 mg/dL — AB (ref 0–99)
Total CHOL/HDL Ratio: 3.2 Ratio
Triglycerides: 57 mg/dL (ref <150)
VLDL: 11 mg/dL (ref 0–40)

## 2014-01-05 LAB — POCT GLYCOSYLATED HEMOGLOBIN (HGB A1C): HEMOGLOBIN A1C: 6.7

## 2014-01-05 MED ORDER — DONEPEZIL HCL 5 MG PO TABS: 5.0000 mg | ORAL_TABLET | Freq: Every evening | ORAL | Status: AC | PRN

## 2014-01-05 MED ORDER — LISINOPRIL-HYDROCHLOROTHIAZIDE 20-12.5 MG PO TABS: 1.0000 | ORAL_TABLET | Freq: Every day | ORAL | Status: DC

## 2014-01-05 MED ORDER — AMLODIPINE BESYLATE 10 MG PO TABS: 10.0000 mg | ORAL_TABLET | Freq: Every day | ORAL | Status: DC

## 2014-01-05 MED ORDER — ATORVASTATIN CALCIUM 10 MG PO TABS: ORAL_TABLET | ORAL | Status: DC

## 2014-01-05 MED ORDER — METFORMIN HCL 850 MG PO TABS: ORAL_TABLET | ORAL | Status: DC

## 2014-01-05 NOTE — Progress Notes (Signed)
   Subjective:    Patient ID: Beth Kelly, female    DOB: Jun 26, 1934, 80 y.o.   MRN: [id number]  HPI He is here for a recheck. She stops taking her Aricept thinking it was causing some side effects. When asked about her other medication she is unsure as to what she is taking. She did not bring her medications with her. She has had an eye exam in the last year and does get regular foot care from her podiatrist. Smoking and drinking were reviewed. Again she is brought in by the mother of her daughter-in-law. This lady seems to be helping with her care here at home. Her exercise is quite minimal.   Review of Systems Negative except as above    Objective:   Physical Exam Alert and in no distress. Foot exam was negative Hemoglobin A1c 6.7      Assessment & Plan:  Diabetes mellitus - Plan: HgB A1c, HM DIABETES FOOT EXAM, POCT UA - Microalbumin, metFORMIN (GLUCOPHAGE) 850 MG tablet, HM DIABETES FOOT EXAM  Hyperlipidemia LDL goal <70 - Plan: CBC with Differential, Comprehensive metabolic panel, Lipid panel, atorvastatin (LIPITOR) 10 MG tablet  Hypertension associated with diabetes - Plan: amLODipine (NORVASC) 10 MG tablet, lisinopril-hydrochlorothiazide (ZESTORETIC) 20-12.5 MG per tablet  UNSPECIFIED ANEMIA  I will start her back on Aricept. She is to return here in one month for a recheck and if she has any difficulty with medication, call me sooner.

## 2014-01-06 MED ORDER — ATORVASTATIN CALCIUM 20 MG PO TABS: 20.0000 mg | ORAL_TABLET | Freq: Every day | ORAL | Status: DC

## 2014-01-06 NOTE — Addendum Note (Signed)
Addended by: Ronnald NianLALONDE, JOHN C on: 01/06/2014 09:01 AM   Modules accepted: Orders

## 2014-06-09 ENCOUNTER — Telehealth: Payer: Self-pay | Admitting: Family Medicine

## 2014-06-09 NOTE — Telephone Encounter (Signed)
Pt does not need Ankle-foot orthosis from [facility]

## 2014-06-09 NOTE — Telephone Encounter (Signed)
Pt does not want Wrist-Hand Orthosis from [facility]

## 2014-09-01 DIAGNOSIS — E119 Type 2 diabetes mellitus without complications: Secondary | ICD-10-CM | POA: Diagnosis not present

## 2014-09-01 DIAGNOSIS — H25813 Combined forms of age-related cataract, bilateral: Secondary | ICD-10-CM | POA: Diagnosis not present

## 2014-09-01 LAB — HM DIABETES EYE EXAM

## 2014-09-07 ENCOUNTER — Ambulatory Visit: Payer: Medicare Other | Admitting: Family Medicine

## 2014-09-07 ENCOUNTER — Telehealth: Payer: Self-pay | Admitting: Family Medicine

## 2014-09-07 NOTE — Telephone Encounter (Signed)
Pt does not use [facility] for medical supplies.

## 2014-09-14 ENCOUNTER — Encounter: Payer: Self-pay | Admitting: Family Medicine

## 2014-11-19 ENCOUNTER — Encounter: Payer: Self-pay | Admitting: Family Medicine

## 2014-11-19 ENCOUNTER — Ambulatory Visit (INDEPENDENT_AMBULATORY_CARE_PROVIDER_SITE_OTHER): Payer: Medicare Other | Admitting: Family Medicine

## 2014-11-19 VITALS — BP 140/80 | HR 60 | Wt 119.0 lb

## 2014-11-19 DIAGNOSIS — E1169 Type 2 diabetes mellitus with other specified complication: Secondary | ICD-10-CM | POA: Diagnosis not present

## 2014-11-19 DIAGNOSIS — G3184 Mild cognitive impairment, so stated: Secondary | ICD-10-CM | POA: Diagnosis not present

## 2014-11-19 DIAGNOSIS — E785 Hyperlipidemia, unspecified: Secondary | ICD-10-CM

## 2014-11-19 DIAGNOSIS — I10 Essential (primary) hypertension: Secondary | ICD-10-CM

## 2014-11-19 DIAGNOSIS — E119 Type 2 diabetes mellitus without complications: Secondary | ICD-10-CM | POA: Diagnosis not present

## 2014-11-19 DIAGNOSIS — E1159 Type 2 diabetes mellitus with other circulatory complications: Secondary | ICD-10-CM

## 2014-11-19 LAB — POCT GLYCOSYLATED HEMOGLOBIN (HGB A1C): Hemoglobin A1C: 6.5

## 2014-11-19 NOTE — Progress Notes (Signed)
  Subjective:    Patient ID: Beth LovettElnora E Kelly, female    DOB: [birth year], 81 y.o.   MRN: [id number]  Beth Kelly is a 81 y.o. female who presents for follow-up of Type 2 diabetes mellitus. She is brought in by her son. Apparently there is a question of difficulty with memory and taking her medications regularly.  Home blood sugar records: Patient does not check Current symptoms/problems none Daily foot checks:   Any foot concerns: none Exercise: working around the house EYE:09/02/14 The following portions of the patient's history were reviewed and updated as appropriate: allergies, current medications, past medical history, past social history and problem list.  ROS as in subjective above.     Objective:    Physical Exam Alert and in no distress otherwise not examined.   Lab Review Diabetic Labs Latest Ref Rng 01/05/2014 07/07/2013 11/19/2012 06/05/2012 01/31/2012  HbA1c - 6.7 6.1 7.0 5.9 5.8  Chol 0 - 200 mg/dL 657(Q227(H) - 469(G212(H) - 295187  HDL >39 mg/dL 72 - 75 - 76  Calc LDL 0 - 99 mg/dL 284(X144(H) - 324(M125(H) - 010(U102(H)  Triglycerides <150 mg/dL 57 - 62 - 45  Creatinine 0.50 - 1.10 mg/dL 7.250.63 - 3.660.72 - 4.400.63   BP/Weight 01/05/2014 07/07/2013 02/13/2013 11/19/2012 06/05/2012  Systolic BP 130 160 130 150 160  Diastolic BP 70 90 70 92 80  Wt. (Lbs) 123 124 118 127 124  BMI 19.86 20.02 19.05 20.51 20.02   Foot/eye exam completion dates 07/17/2013  Eye Exam did not reveal any diabetic retinopathy  Foot Form Completion -    Dasiah  reports that she has quit smoking. She has never used smokeless tobacco. Hemoglobin A1c is 6.5 MMSE 21    Assessment & Plan:     Encounter Diagnoses  Name Primary?  . Diabetes mellitus without complication Yes  . Hyperlipidemia LDL goal <70   . Hypertension associated with diabetes   . Type 2 diabetes mellitus without complication   . Mild cognitive impairment with memory loss     Diabetes mellitus without complication - Plan: POCT glycosylated hemoglobin (Hb  A1C)   Rx changes: none  Education: Reviewed 'ABCs' of diabetes management (respective goals in parentheses):  A1C (<7), blood pressure (<130/80), and cholesterol (LDL <100).  Compliance at present is estimated to be good. Efforts to improve compliance (if necessary) will be directed at none.  Follow up: 4 months   The majority of the time was spent discussing the mild cognitive impairment. There is a question whether she is taking her Aricept regularly. I strongly encouraged her to go to an assisted living which she says she is willing to do. Her sons will work on getting this accomplished. When we can ensure that she is taking her medications regularly, a fuller assessment of her cognitive abilities will be much easier to assess.

## 2014-11-22 ENCOUNTER — Telehealth: Payer: Self-pay | Admitting: Family Medicine

## 2014-11-22 NOTE — Telephone Encounter (Signed)
lm

## 2015-03-22 ENCOUNTER — Ambulatory Visit
Admission: RE | Admit: 2015-03-22 | Discharge: 2015-03-22 | Disposition: A | Discharge status: Home / Self Care | Payer: Medicare Other | Source: Ambulatory Visit | Attending: Family Medicine | Admitting: Family Medicine

## 2015-03-22 ENCOUNTER — Encounter: Payer: Self-pay | Admitting: Family Medicine

## 2015-03-22 ENCOUNTER — Ambulatory Visit (INDEPENDENT_AMBULATORY_CARE_PROVIDER_SITE_OTHER): Payer: Medicare Other | Admitting: Family Medicine

## 2015-03-22 ENCOUNTER — Ambulatory Visit: Payer: Medicare Other | Admitting: Family Medicine

## 2015-03-22 VITALS — BP 150/80 | HR 77 | Wt 118.0 lb

## 2015-03-22 DIAGNOSIS — M545 Low back pain, unspecified: Secondary | ICD-10-CM

## 2015-03-22 NOTE — Progress Notes (Signed)
   Subjective:    Patient ID: Beth Kelly, female    DOB: 02/16/1934, 81 y.o.   MRN: [id number]  HPI She complains of a several month history of right low back pain. Apparently it does get worse with physical activity. There's been no numbness, tingling or weakness. She is using a cane to help with this. No other joints are involved.   Review of Systems     Objective:   Physical Exam Tender to palpation over the right SI joint however figure 4 test was negative. Negative straight leg raising. Good hip motion. No other palpable tenderness is noted.       Assessment & Plan:  Right-sided low back pain without sciatica - Plan: DG Lumbar Spine 2-3 Views If the x-rays show nothing more than arthritic changes, I will arrange for physical therapy to help with this.

## 2015-03-23 ENCOUNTER — Other Ambulatory Visit: Payer: Self-pay

## 2015-03-23 DIAGNOSIS — M545 Low back pain, unspecified: Secondary | ICD-10-CM

## 2015-04-04 ENCOUNTER — Ambulatory Visit: Payer: Medicare Other | Attending: Family Medicine | Admitting: Physical Therapy

## 2015-04-04 DIAGNOSIS — M545 Low back pain, unspecified: Secondary | ICD-10-CM

## 2015-04-04 DIAGNOSIS — R2681 Unsteadiness on feet: Secondary | ICD-10-CM | POA: Insufficient documentation

## 2015-04-04 DIAGNOSIS — R531 Weakness: Secondary | ICD-10-CM | POA: Diagnosis not present

## 2015-04-04 DIAGNOSIS — R269 Unspecified abnormalities of gait and mobility: Secondary | ICD-10-CM | POA: Diagnosis not present

## 2015-04-04 NOTE — Therapy (Signed)
[facility] [address] [place], [place], [address] Phone: [phone]   Fax:  [phone]  Physical Therapy Evaluation  Patient Details  Name: Beth Kelly MRN: [id number] Date of Birth: 10-25-1933 Referring Provider:  Ronnald NianLalonde, John C, MD  Encounter Date: 04/04/2015      PT End of Session - 04/04/15 1640    Visit Number 1   Number of Visits 16   Date for PT Re-Evaluation 05/30/15   Authorization Type Medicare   Authorization Time Period 05-30-15   PT Start Time 0345   PT Stop Time 0429   PT Time Calculation (min) 44 min   Activity Tolerance Patient tolerated treatment well   Behavior [facility] Therapy [facility] for tasks assessed/performed      Past Medical History  Diagnosis Date  . Diabetes mellitus   . Hypertension   . Arthritis   . Dyslipidemia     No past surgical history on file.  There were no vitals filed for this visit.  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  I have been having pain for years. but recently I know I am weaker in my back   Patient is accompained by: Family member  in law [place]   Pertinent History no surgeries on back   Limitations House hold activities   How long can you sit comfortably? unlimited   How long can you stand comfortably? 20 min   How long can you walk comfortably? 20 min   Patient Stated Goals control pain, get stronger   Currently in Pain? Yes   Pain Score 5    Pain Location Back  tenderness over Left center   Pain Orientation Right;Left;Mid;Lower   Pain Descriptors / Indicators Sore;Tightness;Tiring   Pain Type Chronic pain   Pain Onset More than a month ago   Pain Frequency Intermittent   Aggravating Factors  raking leaves  , getting up and down from chair   Pain Relieving Factors medicine, sit and rest.  Aleve at night            [facility] - 04/04/15 1557    Assessment   Medical Diagnosis low back pain without sciatica   Onset Date/Surgical Date 09/01/15   Hand Dominance Right   Next MD Visit Dr. Susann GivensLalonde after PT   Prior Therapy none   Precautions   Precautions Fall  Pt with generallized weakness and must use cane   Precaution Comments Pt with dementia   Restrictions   Weight Bearing Restrictions No   Balance Screen   Has the patient fallen in the past 6 months No   Has the patient had a decrease in activity level because of a fear of falling?  Yes  cant shop for more 20 minutes   Is the patient reluctant to leave their home because of a fear of falling?  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  Cognition   Overall Cognitive Status History of cognitive impairments - at baseline   Memory Impaired  long term memory but short term memory impaired   Observation/Other Assessments   Focus on Therapeutic Outcomes (FOTO)  intake 52% limitation 48% predicted 42%   Posture/Postural Control   Posture/Postural Control Postural limitations   Postural Limitations Anterior pelvic tilt;Weight shift right   Posture Comments left pelvis higher than Right   AROM   Lumbar Flexion 50   Lumbar Extension 10   Lumbar - Right Side Bend 15   Lumbar - Left Side Bend 18   Lumbar - Right Rotation 50%   Lumbar - Left Rotation 50%   Strength   Overall Strength Deficits   Right Hip Flexion 4-/5   Right Hip ABduction 3-/5   Left Hip Flexion 4-/5   Left Hip ABduction 3-/5   Palpation   Palpation comment tenderness  over Right priiformis   Transfers   Five time sit to stand comments  Pt blocks knees on chair and uses momentum to come to stand with excessive use of hands   Ambulation/Gait   Gait velocity 2.56ft/ sec   Gait Comments gaurding and holds onto left hip with left hand and usese cane in right                   [facility] Adult PT Treatment/Exercise - 04/04/15 1557    Knee/Hip Exercises: Stretches   Piriformis Stretch 2 reps;60 seconds  right in supine and sitting   Modalities   Modalities Ultrasound   Ultrasound   Ultrasound Location Right SI piriformis attachiment   Ultrasound Parameters 1.5 100% continuous for 8 min   Ultrasound Goals Pain                PT Education - 04/04/15 1625    Education provided Yes   Education Details Pt and relative explained findings, given piriformis stretch sitting and supine   Person(s) Educated Patient;Caregiver(s)   Methods Explanation;Demonstration;Verbal cues;Tactile cues;Handout   Comprehension Verbalized understanding;Returned demonstration;Need further instruction          PT Short Term Goals - 04/04/15 1640    PT SHORT TERM GOAL #1   Title independent wtih initial HEP   Time 4   Period Weeks   Status New   PT SHORT TERM GOAL #2   Title report pain decrease from 5/10 to 3/10 at rest in low back   Time 4   Period Weeks   Status New   PT SHORT TERM GOAL #3   Title Demonstrate understanding of proper sitting posture and be more conscious of postion and posture athrough out the day.   Time 4   Period Weeks   Status New   PT SHORT TERM GOAL #4   Title Perform BERG and set LTG   Time 2   Period Weeks   Status New           PT Long Term Goals - 04/04/15 1625    PT LONG TERM GOAL #1   Title Demonstrate and verbalize techniques to reduce the risk of re injury including  lifting, posture, body mechanics   Time 8   Period Weeks   Status New   PT LONG TERM GOAL #2   Title Pt will be able to complete advanced HEP  with help of family member and written HEP   Time 8   Period Weeks   Status New   PT LONG TERM GOAL #3  Title Pt will be 1/10 or less with all functional activity   Time 8   Period Weeks   Status New   PT LONG TERM GOAL #4   Title FOTO will improve from 48% limitation to at least 40 % to indicate improved functional activity   Time 8   Period Weeks   Status New   PT LONG TERM GOAL #5   Title Pt gait velocity will improve to at least 2.62 ft/sec for decrease risk of fall in community   Time 8   Period Weeks   Status New   Additional Long Term Goals   Additional Long Term Goals Yes   PT LONG TERM GOAL #6   Title Pt will be able shop for groceries for at least 30 minutes without leaving due to back pain.    Time 8   Period Weeks   Status New               Plan - 04/27/15 1630    Clinical Impression Statement Pt is 81 yo female that lives alone in one level home and is looked after by several family member.  Pt is accompanied today by in law [place] who reports that Beth Kelly has dementia (short term memory deficit)  Pt mentioned she worked in [employer] with concrete floor 5 times during evaluation.  Pt is pleasant and demonstrates generalized strength deficits.  She ambulates with an anterior tilt and hyperextended knees and decreased trunk rotation. compensting for core weakness.  Pt has not had any incidents of falls ,but pt is at risk due to weakness,  Pt utilizes a straight cane. pt Gait velocity is 2.37 ft/sec which is limited communtiy ambulator.  Pt would like to be able to shop in store longer than 20 minutes in order to shop. Pt presents with signs and symptoms compatible with low back pain indicative of stenosis and  also piriformis pain.  Pt also is unsteady on feet and demostrate LE and core weakness.  Pt would benefit from skilled PT  in order to maximize function and safety against fall risk in order to continue living alone at home with family involvement..    Pt  will benefit from skilled therapeutic intervention in order to improve on the following deficits Abnormal gait;Decreased activity tolerance;Decreased balance;Decreased mobility;Decreased endurance;Difficulty walking;Increased muscle spasms;Improper body mechanics;Postural dysfunction;Pain;Decreased safety awareness;Decreased range of motion   Rehab Potential Good   PT Frequency 2x / week   PT Duration 8 weeks   PT Treatment/Interventions ADLs/Self Care Home Management;Cryotherapy;Electrical Stimulation;Gait training;Stair training;Moist Heat;Iontophoresis 4mg /ml Dexamethasone;Balance training;Therapeutic exercise;Functional mobility training;Neuromuscular re-education;Manual techniques;Patient/family education   PT Next Visit Plan BERG balance.   Core exercise for abdominals, bridging.  Assess piriformis stretch and [place] benefit for last visit.  sit to stand exercise with UE support. assess leg length   Consulted and Agree with Plan of Care Patient;Family member/caregiver          G-Codes - Apr 27, 2015 1859    Functional Assessment Tool Used FOTO   Functional Limitation Mobility: Walking and moving around   Mobility: Walking and Moving Around Current Status [phone]) At least 40 percent but less than 60 percent impaired, limited or restricted   Mobility: Walking and Moving Around Goal Status [phone]) At least 20 percent but less than 40 percent impaired, limited or restricted       Problem List Patient Active Problem List   Diagnosis Date Noted  . Hyperlipidemia LDL goal <70 07/31/2011  . Hypertension  associated with diabetes 07/31/2011  . Diabetes mellitus 07/31/2011  . UNSPECIFIED ANEMIA 09/29/2008    Garen Lah, PT 04/04/2015 7:27 PM Phone: [phone] Fax: [phone]  By signing I understand that I am ordering/authorizing the use of Iontophoresis using 4 mg/mL of dexamethasone as a component of this plan of care. [facility] [address] [place], [place], [address] Phone: [phone]   Fax:  [phone]

## 2015-04-04 NOTE — Patient Instructions (Signed)
   Hip Stretch  Put right ankle over left knee. Let right knee fall downward, but keep ankle in place. Feel the stretch in hip. May push down gently with hand to feel stretch. Hold __30-60__ seconds while counting out loud. Repeat with other leg. Repeat _2-3___ times. Do __2-3__ sessions per day.     Stretching: Piriformis (Supine)  Pull right knee toward opposite shoulder. Hold __30-60__ seconds. Relax. Repeat __2-3__ times per set. Do 1____ sets per session. Do __2-3__ sessions per day.          Copyright  VHI. All rights reserved.  Garen LahLawrie Edilson Vital, PT 04/04/2015 4:15 PM Phone: [phone] Fax: [phone]

## 2015-04-12 ENCOUNTER — Ambulatory Visit: Payer: Medicare Other | Admitting: Physical Therapy

## 2015-04-12 DIAGNOSIS — R269 Unspecified abnormalities of gait and mobility: Secondary | ICD-10-CM

## 2015-04-12 DIAGNOSIS — R2681 Unsteadiness on feet: Secondary | ICD-10-CM | POA: Diagnosis not present

## 2015-04-12 DIAGNOSIS — M545 Low back pain, unspecified: Secondary | ICD-10-CM

## 2015-04-12 DIAGNOSIS — R531 Weakness: Secondary | ICD-10-CM

## 2015-04-12 NOTE — Patient Instructions (Signed)
Abduction: Clam (Eccentric) - Side-Lying   Lie on side with knees bent. Lift top knee, keeping feet together. Keep trunk steady. Slowly lower for 3-5 seconds. 15__ reps per set, _2__ sets per day, _7__ days per week.   Copyright  VHI. All rights reserved.   Bridge   Lie back, legs bent. Inhale, pressing hips up. Keeping ribs in, lengthen lower back. HOLD 5 seconds.  Exhale, rolling down along spine from top. Repeat _10___ times. Do __2__ sessions per day.   Heel Slide to Straight   Slide one leg down to straight. Return. Be sure pelvis does not rock forward, tilt, rotate, or tip to side. Do _10__ times. Restabilize pelvis. Repeat with other leg. Do __1-2_ sets, __2_ times per day.  http://ss.exer.us/16   Copyright  VHI. All rights reserved.   Quad Sets   Slowly tighten thigh muscles of straight, left leg while counting out loud to __5__. Relax. Repeat __10__ times each leg. Do __2__ sessions per day.  http://gt2.exer.us/293   Copyright  VHI. All rights reserved.

## 2015-04-12 NOTE — Therapy (Signed)
[facility] [address] [place], [place], [address] Phone: [phone]   Fax:  [phone]  Physical Therapy Treatment  Patient Details  Name: CLOVER FEEHAN MRN: [id number] Date of Birth: Jan 19, 1934 Referring Provider:  Ronnald Nian, MD  Encounter Date: 04/12/2015      PT End of Session - 04/12/15 1518    Visit Number 2   Number of Visits 16   Date for PT Re-Evaluation 05/30/15   PT Start Time 0302   PT Stop Time 0358   PT Time Calculation (min) 56 min      Past Medical History  Diagnosis Date  . Diabetes mellitus   . Hypertension   . Arthritis   . Dyslipidemia     No past surgical history on file.  There were no vitals filed for this visit.  Visit Diagnosis:  Generalized weakness  Abnormality of gait  Unsteadiness on feet  Bilateral low back pain without sciatica      Subjective Assessment - 04/12/15 1604    Currently in Pain? No/denies            [facility] PT Assessment - 04/12/15 1503    ROM / Strength   AROM / PROM / Strength Strength   Strength   Strength Assessment Site Knee   Right/Left Hip Right;Left   Right/Left Knee Right;Left   Right Knee Flexion 3/5   Right Knee Extension --  unable to perform quad set or SAQ   Left Knee Flexion 3/5   Left Knee Extension --  unable to perfporm quad set or SAQ   Berg Balance Test   Sit to Stand Able to stand  independently using hands   Standing Unsupported Able to stand safely 2 minutes   Sitting with Back Unsupported but Feet Supported on Floor or Stool Able to sit safely and securely 2 minutes   Stand to Sit Sits safely with minimal use of hands   Transfers Able to transfer safely, definite need of hands   Standing Unsupported with Eyes Closed Able to stand 10 seconds safely   Standing Ubsupported with Feet Together Able to place feet together independently and stand 1 minute safely   From Standing, Reach Forward with Outstretched Arm Can reach  forward >12 cm safely (5")   From Standing Position, Pick up Object from Floor Able to pick up shoe, needs supervision  does not bend knees    From Standing Position, Turn to Look Behind Over each Shoulder Looks behind from both sides and weight shifts well   Turn 360 Degrees Able to turn 360 degrees safely but slowly   Standing Unsupported, Alternately Place Feet on Step/Stool Able to stand independently and safely and complete 8 steps in 20 seconds   Standing Unsupported, One Foot in Front Able to plae foot ahead of the other independently and hold 30 seconds   Standing on One Leg Tries to lift leg/unable to hold 3 seconds but remains standing independently   Total Score 46                     [facility] Adult PT Treatment/Exercise - 04/12/15 1533    Lumbar Exercises: Aerobic   Stationary Bike Nustep Level 3 LE only x 9 min with cues for quad set   Lumbar Exercises: Supine   Clam 20 reps   Heel Slides 10 reps   Bridge 10 reps   Bridge Limitations minimal lift   Straight Leg Raises Limitations quad  lag bilateral   Other Supine Lumbar Exercises adduction squeeze with ab set x 10 5 sec holds   Lumbar Exercises: Sidelying   Clam 10 reps  bilateral   Knee/Hip Exercises: Seated   Long Arc Quad Right;Left   Long Arc Quad Limitations unable due to weakness   Sit to [employer] 5 reps;with UE support  tried with HHA, pt must use hands to push   Knee/Hip Exercises: Supine   Quad Sets Both   [employer] Limitations unable to contract   Short Arc [employer] Both   Short Arc Quad Sets Limitations unable due to weakness                PT Education - 04/12/15 1600    Education provided Yes   Education Details Bridge, heel slides, quad sets, clams in sidelying   Person(s) Educated Patient;Other (comment)  relative   Methods Explanation;Handout   Comprehension Verbal cues required;Tactile cues required;Need further instruction  family will assist at home          PT Short  Term Goals - 04/04/15 1640    PT SHORT TERM GOAL #1   Title independent wtih initial HEP   Time 4   Period Weeks   Status New   PT SHORT TERM GOAL #2   Title report pain decrease from 5/10 to 3/10 at rest in low back   Time 4   Period Weeks   Status New   PT SHORT TERM GOAL #3   Title Demonstrate understanding of proper sitting posture and be more conscious of postion and posture athrough out the day.   Time 4   Period Weeks   Status New   PT SHORT TERM GOAL #4   Title Perform BERG and set LTG   Time 2   Period Weeks   Status New           PT Long Term Goals - 04/04/15 1625    PT LONG TERM GOAL #1   Title Demonstrate and verbalize techniques to reduce the risk of re injury including  lifting, posture, body mechanics   Time 8   Period Weeks   Status New   PT LONG TERM GOAL #2   Title Pt will be able to complete advanced HEP with help of family member and written HEP   Time 8   Period Weeks   Status New   PT LONG TERM GOAL #3   Title Pt will be 1/10 or less with all functional activity   Time 8   Period Weeks   Status New   PT LONG TERM GOAL #4   Title FOTO will improve from 48% limitation to at least 40 % to indicate improved functional activity   Time 8   Period Weeks   Status New   PT LONG TERM GOAL #5   Title Pt gait velocity will improve to at least 2.62 ft/sec for decrease risk of fall in community   Time 8   Period Weeks   Status New   Additional Long Term Goals   Additional Long Term Goals Yes   PT LONG TERM GOAL #6   Title Pt will be able shop for groceries for at least 30 minutes without leaving due to back pain.    Time 8   Period Weeks   Status New               Plan - 04/12/15 1607    Clinical Impression Statement 46/56  BERG. Instructed pt in LE and core strengthening with most difficulty with quad strength. Pt is unable to perform active [employer], SAQ or LAQ. She is unable to sit without flexing trunk and demonstrates no control with  descent. HEP issued and expained to family member who will be assisting her at home.    PT Next Visit Plan Review HEP and piriformis stretch, continue sit to stand exercises with UE support, assess leg length, LE strength        Problem List Patient Active Problem List   Diagnosis Date Noted  . Hyperlipidemia LDL goal <70 07/31/2011  . Hypertension associated with diabetes 07/31/2011  . Diabetes mellitus 07/31/2011  . UNSPECIFIED ANEMIA 09/29/2008    Sherrie Mustache, PTA 04/12/2015, 4:14 PM  [facility] [address] [place], [place], [address] Phone: [phone]   Fax:  [phone]

## 2015-04-22 ENCOUNTER — Ambulatory Visit: Payer: Medicare Other | Admitting: Rehabilitative and Restorative Service Providers"

## 2015-04-26 ENCOUNTER — Ambulatory Visit: Payer: Medicare Other | Attending: Family Medicine | Admitting: Physical Therapy

## 2015-04-26 DIAGNOSIS — R531 Weakness: Secondary | ICD-10-CM | POA: Insufficient documentation

## 2015-04-26 DIAGNOSIS — M545 Low back pain, unspecified: Secondary | ICD-10-CM

## 2015-04-26 DIAGNOSIS — R2681 Unsteadiness on feet: Secondary | ICD-10-CM | POA: Diagnosis not present

## 2015-04-26 DIAGNOSIS — R269 Unspecified abnormalities of gait and mobility: Secondary | ICD-10-CM

## 2015-04-26 NOTE — Therapy (Signed)
[facility] [address] [place], [place], [address] Phone: [phone]   Fax:  [phone]  Physical Therapy Treatment  Patient Details  Name: Beth Kelly MRN: [id number] Date of Birth: 1934-08-03 Referring Provider:  Ronnald Nian, MD  Encounter Date: 04/26/2015      PT End of Session - 04/26/15 1556    Visit Number 3   Number of Visits 16   Date for PT Re-Evaluation 05/30/15   Authorization Type Medicare   Authorization Time Period 05-30-15   PT Start Time 0345   PT Stop Time 0430   PT Time Calculation (min) 45 min      Past Medical History  Diagnosis Date  . Diabetes mellitus   . Hypertension   . Arthritis   . Dyslipidemia     No past surgical history on file.  There were no vitals filed for this visit.  Visit Diagnosis:  Abnormality of gait  Unsteadiness on feet  Bilateral low back pain without sciatica  Generalized weakness      Subjective Assessment - 04/26/15 1552    Subjective I have done the home exercises once since my last visit 3 weeks ago. Family member reports it is difficult to get her to participate as well as she just had 4 teetch pulled his week. Right knee stiff.    Patient is accompained by: Family member   Currently in Pain? No/denies   Aggravating Factors  transitions   Pain Relieving Factors medicine, sit and rest                         [facility] Adult PT Treatment/Exercise - 04/26/15 1610    Lumbar Exercises: Aerobic   Stationary Bike Nustep Level 3 LE only x 6 min with cues for quad set   Lumbar Exercises: Supine   Clam 20 reps   Heel Slides 10 reps   Heel Slides Limitations bilateral   Straight Leg Raises Limitations quad lag bilateral   Knee/Hip Exercises: Seated   Long Arc Quad Right;Left   Long Arc Quad Limitations with assistance   Ball Squeeze x10   Clamshell with TheraBand Red  x10   Knee/Hip Flexion march 10 10 x 2 each   Sit to Sand 10 reps   from elevated seat using UE assist and focus on lowering   Knee/Hip Exercises: Supine   Quad Sets Both   Quad Sets Limitations unable,uses glutes   Short Arc [employer] Both   Short Arc Quad Sets Limitations minimal lift, needs assist                  PT Short Term Goals - 04/26/15 1626    PT SHORT TERM GOAL #1   Title independent wtih initial HEP   Time 4   Period Weeks   Status On-going  has performed once   PT SHORT TERM GOAL #2   Title report pain decrease from 5/10 to 3/10 at rest in low back   Time 4   Period Weeks   Status On-going  no complaints this week per care giver   PT SHORT TERM GOAL #3   Title Demonstrate understanding of proper sitting posture and be more conscious of postion and posture athrough out the day.   Time 4   Period Weeks   Status On-going  constant cues for posture   PT SHORT TERM GOAL #4   Title Perform BERG and set LTG  Time 2   Period Weeks   Status Partially Met  need goal           PT Long Term Goals - 04/26/15 1627    PT LONG TERM GOAL #1   Title Demonstrate and verbalize techniques to reduce the risk of re injury including  lifting, posture, body mechanics   Time 8   Period Weeks   Status On-going   PT LONG TERM GOAL #2   Title Pt will be able to complete advanced HEP with help of family member and written HEP   Time 8   Status On-going   PT LONG TERM GOAL #3   Title Pt will be 1/10 or less with all functional activity   Time 8   Period Weeks   Status On-going   PT LONG TERM GOAL #4   Title FOTO will improve from 48% limitation to at least 40 % to indicate improved functional activity   Time 8   Period Weeks   Status On-going   PT LONG TERM GOAL #5   Title Pt gait velocity will improve to at least 2.62 ft/sec for decrease risk of fall in community   Time 8   Period Weeks   Status On-going   PT LONG TERM GOAL #6   Title Pt will be able shop for groceries for at least 30 minutes without leaving due to back  pain.    Time 8   Period Weeks   Status On-going               Plan - 04/26/15 1722    Clinical Impression Statement Minimal HEP compliance due to memory issues. Maximal use of UE to rise from chair. Constant cues to decrease compensation with si-stand trials. Requires active assist with all quad exercies.    PT Next Visit Plan Review HEP and piriformis stretch, continue sit to stand exercises with UE support, assess leg length, LE strength        Problem List Patient Active Problem List   Diagnosis Date Noted  . Hyperlipidemia LDL goal <70 07/31/2011  . Hypertension associated with diabetes 07/31/2011  . Diabetes mellitus 07/31/2011  . UNSPECIFIED ANEMIA 09/29/2008    Dorene Ar, PTA 04/26/2015, 5:24 PM  [facility] [address] [place], [place], [address] Phone: [phone]   Fax:  [phone]

## 2015-04-28 ENCOUNTER — Ambulatory Visit: Payer: Medicare Other | Admitting: Physical Therapy

## 2015-04-28 DIAGNOSIS — R531 Weakness: Secondary | ICD-10-CM

## 2015-04-28 DIAGNOSIS — M545 Low back pain, unspecified: Secondary | ICD-10-CM

## 2015-04-28 DIAGNOSIS — R269 Unspecified abnormalities of gait and mobility: Secondary | ICD-10-CM | POA: Diagnosis not present

## 2015-04-28 DIAGNOSIS — R2681 Unsteadiness on feet: Secondary | ICD-10-CM

## 2015-04-28 NOTE — Therapy (Signed)
[place], [place], [address] Phone: [phone]   Fax:  [phone]  Physical Therapy Treatment  Patient Details  Name: Beth Kelly MRN: [id number] Date of Birth: 1934/06/01 Referring Provider:  Denita Lung, MD  Encounter Date: 04/28/2015      PT End of Session - 04/28/15 1717    Visit Number 4   Number of Visits 16   Date for PT Re-Evaluation 05/30/15   Authorization Type Medicare   Authorization Time Period 05-30-15   PT Start Time 0430   PT Stop Time 0515   PT Time Calculation (min) 45 min      Past Medical History  Diagnosis Date  . Diabetes mellitus   . Hypertension   . Arthritis   . Dyslipidemia     No past surgical history on file.  There were no vitals filed for this visit.  Visit Diagnosis:  Unsteadiness on feet  Bilateral low back pain without sciatica  Generalized weakness  Abnormality of gait      Subjective Assessment - 04/28/15 1717    Subjective My hip is not moving right. No pain.   Currently in Pain? No/denies                         [facility] Adult PT Treatment/Exercise - 04/28/15 0001    Lumbar Exercises: Aerobic   Stationary Bike Nustep Level 3 LE only x 7 min with cues for quad set   Lumbar Exercises: Supine   Bridge 10 reps   Bridge Limitations minimal lift with ball squeeze    Large Ball Oblique Isometric Limitations Bridge with ball squeeze and all springs with cues for increased lift.    Other Supine Lumbar Exercises Pilates Reformer used for foot work (1 red 1 yellow) to engage core and activate quads during parallel  on toes and heels with ball for alignment and cues throughout for neutral spine and abdominal engagement.    Knee/Hip Exercises: Standing   Other Standing Knee Exercises resisted backward and forward walking for quad activation/terminal knee extension 51ftx2 each way   Knee/Hip Exercises: Seated   Long Arc Quad  Right;Left;10 reps   Long Arc Quad Limitations with assistance   Heel Slides Both;10 reps   Heel Slides Limitations with manual resist during extension for quad activation   Other Seated Knee/Hip Exercises fitter with one black rope for terminal knee extension x 15 each                  PT Short Term Goals - 04/26/15 1626    PT SHORT TERM GOAL #1   Title independent wtih initial HEP   Time 4   Period Weeks   Status On-going  has performed once   PT SHORT TERM GOAL #2   Title report pain decrease from 5/10 to 3/10 at rest in low back   Time 4   Period Weeks   Status On-going  no complaints this week per care giver   PT SHORT TERM GOAL #3   Title Demonstrate understanding of proper sitting posture and be more conscious of postion and posture athrough out the day.   Time 4   Period Weeks   Status On-going  constant cues for posture   PT SHORT TERM GOAL #4   Title Perform BERG and set LTG   Time 2   Period Weeks   Status Partially Met  need goal  PT Long Term Goals - 04/26/15 1627    PT LONG TERM GOAL #1   Title Demonstrate and verbalize techniques to reduce the risk of re injury including  lifting, posture, body mechanics   Time 8   Period Weeks   Status On-going   PT LONG TERM GOAL #2   Title Pt will be able to complete advanced HEP with help of family member and written HEP   Time 8   Status On-going   PT LONG TERM GOAL #3   Title Pt will be 1/10 or less with all functional activity   Time 8   Period Weeks   Status On-going   PT LONG TERM GOAL #4   Title FOTO will improve from 48% limitation to at least 40 % to indicate improved functional activity   Time 8   Period Weeks   Status On-going   PT LONG TERM GOAL #5   Title Pt gait velocity will improve to at least 2.62 ft/sec for decrease risk of fall in community   Time 8   Period Weeks   Status On-going   PT LONG TERM GOAL #6   Title Pt will be able shop for groceries for at least 30  minutes without leaving due to back pain.    Time 8   Period Weeks   Status On-going               Plan - 04/28/15 1717    Clinical Impression Statement Review of Hep with minimal quad activity. Trial of reformer for core/ LE strengthening with good muscle recruitment in quads felt.    PT Next Visit Plan continue core /quad activation sit-stand, assess leg length, LE strength CONTINUE REFORMER        Problem List Patient Active Problem List   Diagnosis Date Noted  . Hyperlipidemia LDL goal <70 07/31/2011  . Hypertension associated with diabetes 07/31/2011  . Diabetes mellitus 07/31/2011  . UNSPECIFIED ANEMIA 09/29/2008    Dorene Ar, PTA 04/28/2015, 5:25 PM  [place], [place], [address] Phone: [phone]   Fax:  [phone]

## 2015-05-03 ENCOUNTER — Ambulatory Visit: Payer: Medicare Other | Admitting: Physical Therapy

## 2015-05-03 DIAGNOSIS — R531 Weakness: Secondary | ICD-10-CM | POA: Diagnosis not present

## 2015-05-03 DIAGNOSIS — R269 Unspecified abnormalities of gait and mobility: Secondary | ICD-10-CM

## 2015-05-03 DIAGNOSIS — M545 Low back pain, unspecified: Secondary | ICD-10-CM

## 2015-05-03 DIAGNOSIS — R2681 Unsteadiness on feet: Secondary | ICD-10-CM

## 2015-05-03 NOTE — Therapy (Signed)
[place], [place], [address] Phone: [phone]   Fax:  [phone]  Physical Therapy Treatment  Patient Details  Name: Beth Kelly MRN: [id number] Date of Birth: September 07, 1934 Referring Provider:  Denita Lung, MD  Encounter Date: 05/03/2015      PT End of Session - 05/03/15 1540    Visit Number 5   Number of Visits 16   Date for PT Re-Evaluation 05/30/15   Authorization Type Medicare   Authorization Time Period 05-30-15   PT Start Time 1500   PT Stop Time 1543   PT Time Calculation (min) 43 min   Activity Tolerance Patient tolerated treatment well      Past Medical History  Diagnosis Date  . Diabetes mellitus   . Hypertension   . Arthritis   . Dyslipidemia     No past surgical history on file.  There were no vitals filed for this visit.  Visit Diagnosis:  Unsteadiness on feet  Bilateral low back pain without sciatica  Generalized weakness  Abnormality of gait      Subjective Assessment - 05/03/15 1510    Subjective No pain today.  I think I'm getting better.  I just need some dentures and I'll be OK.     Currently in Pain? No/denies   Pain Location Back   Aggravating Factors  can't think of anything            [facility] PT Assessment - 05/03/15 1524    AROM   Lumbar Flexion 60   Lumbar Extension 20   Lumbar - Right Side Bend 40   Lumbar - Left Side Bend 40   Ambulation/Gait   Gait velocity .99msec                     [facility] Adult PT Treatment/Exercise - 05/03/15 1524    Lumbar Exercises: Aerobic   Stationary Bike Nustep Level 3 LE only x 7 min with cues for quad set   Lumbar Exercises: Supine   Bent Knee Raise 10 reps   Bridge 10 reps   Isometric Hip Flexion 10 reps   Lumbar Exercises: Sidelying   Clam 10 reps   Knee/Hip Exercises: Standing   Hip ADduction AROM;Right;Left;1 set;20 reps   Hip Extension Right;AROM;Left;1 set;20 reps   Lateral Step Up  Right;Left;1 set;15 reps;Hand Hold: 2;Step Height: 4"   Knee/Hip Exercises: Seated   Long Arc Quad Right;Left;10 reps;Strengthening   Knee/Hip Flexion march 10 10 x 2 each   Sit to [employer]10 reps   Modalities   Modalities Cryotherapy   Cryotherapy   Number Minutes Cryotherapy 5 Minutes   Cryotherapy Location Hip  right hip after fall   Type of Cryotherapy Ice pack                PT Education - 05/03/15 1540    Education provided Yes   Education Details cane height adjustment   Person(s) Educated Patient   Methods Explanation;Demonstration   Comprehension Verbalized understanding          PT Short Term Goals - 05/03/15 2001    PT SHORT TERM GOAL #1   Title independent wtih initial HEP   Time 4   Period Weeks   Status On-going   PT SHORT TERM GOAL #2   Title report pain decrease from 5/10 to 3/10 at rest in low back   Time 4   Period Weeks   Status On-going  PT SHORT TERM GOAL #3   Title Demonstrate understanding of proper sitting posture and be more conscious of postion and posture athrough out the day.   Period Weeks   Status On-going   PT SHORT TERM GOAL #4   Title BERG balance score improved to 49/56 indicating improved balance   Baseline 46/56   Time 2   Period Weeks   Status Partially Met           PT Long Term Goals - 05/03/15 2002    PT LONG TERM GOAL #1   Title Demonstrate and verbalize techniques to reduce the risk of re injury including  lifting, posture, body mechanics   Time 8   Period Weeks   Status On-going   PT LONG TERM GOAL #2   Title Pt will be able to complete advanced HEP with help of family member and written HEP   Time 8   Period Weeks   Status On-going   PT LONG TERM GOAL #3   Title Pt will be 1/10 or less with all functional activity   Period Weeks   Status On-going   PT LONG TERM GOAL #4   Title FOTO will improve from 48% limitation to at least 40 % to indicate improved functional activity   Time 8   Period Weeks    Status On-going   PT LONG TERM GOAL #5   Title Pt gait velocity will improve to at least 2.62 ft/sec for decrease risk of fall in community   Time 8   Period Weeks   Status On-going               Plan - 05/03/15 1541    Clinical Impression Statement The patient is quite pleasant and denies pain throughout treatment session.  She performs supine and seated exercises with ease followed by gait with her single point cane following cane height adjustment.  She then participated in standing LE exercises while holding the countertop, when suddenly her knee gives way and patient fell to the floor without warning. She fell onto her right hip.  She was assisted to a chair by this therapist in addition to Melvenia Needles PTA.  The patient denies injury but a cold pack was placed to her right hip and vitals were taken and WNLs.  Ms. Pinson family member who was in the waiting room was told and came into the clinic.  She states she knows the patient  "shouldn't be living alone."  Discussed with Ms. Schrade and her family member that if she has any pain or difficulty weight bearing she should seek medical attention for x-rays.  Ms. Hritz states she in uninjured and states "she falls all the time at home."  She is able to ambulate as usual  with her cane out of the clinic without limp or abnormal signs of any kind.  Although Ms. Rhodus previous history  and BERG score indicate only a moderate risk of falls, she now needs close contact guard supervision with gait belt in standing for safety after this unexpected fall today when her legs gave way without any warning.     PT Next Visit Plan assess status following fall onto right hip today; proceed with caution in standing for safety        Problem List Patient Active Problem List   Diagnosis Date Noted  . Hyperlipidemia LDL goal <70 07/31/2011  . Hypertension associated with diabetes 07/31/2011  . Diabetes mellitus 07/31/2011  . UNSPECIFIED ANEMIA  09/29/2008     Alvera Singh 05/03/2015, 8:04 PM  [facility] [address] [place], [place], [address] Phone: [phone]   Fax:  [phone]  Ruben Im, PT 05/03/2015 8:06 PM Phone: [phone] Fax: [phone]

## 2015-05-05 ENCOUNTER — Ambulatory Visit: Payer: Medicare Other | Admitting: Physical Therapy

## 2015-05-05 DIAGNOSIS — R2681 Unsteadiness on feet: Secondary | ICD-10-CM | POA: Diagnosis not present

## 2015-05-05 DIAGNOSIS — M545 Low back pain, unspecified: Secondary | ICD-10-CM

## 2015-05-05 DIAGNOSIS — R269 Unspecified abnormalities of gait and mobility: Secondary | ICD-10-CM | POA: Diagnosis not present

## 2015-05-05 DIAGNOSIS — R531 Weakness: Secondary | ICD-10-CM | POA: Diagnosis not present

## 2015-05-05 NOTE — Patient Instructions (Signed)
Functional Quadriceps: Sit to Stand   Sit on edge of chair, feet flat on floor, with table in front of you. Stand upright, extending knees fully - use arms as little as possible.  Repeat _10___ times per set. Do __1__ sets per session. Do __1__ sessions per day.  http://orth.exer.us/735   Copyright  VHI. All rights reserved.  Long Arc Knee Extension   Sitting in a chair, kick one leg as straight as you can. Then bend it. Then place it back on the floor. Repeat with other leg. "Kick, Bend, Floor"  Repeat _10-15__ times. Do _1-2__ times a day.  Copyright  VHI. All rights reserved.

## 2015-05-05 NOTE — Therapy (Signed)
[place], [place], [address] Phone: [phone]   Fax:  [phone]  Physical Therapy Treatment  Patient Details  Name: Beth Kelly MRN: [id number] Date of Birth: October 27, 1933 Referring Provider:  Denita Lung, MD  Encounter Date: 05/05/2015      PT End of Session - 05/05/15 1554    Visit Number 6   Number of Visits 16   Date for PT Re-Evaluation 05/30/15   Authorization Type Medicare   Authorization Time Period 05-30-15   PT Start Time 1550   PT Stop Time 1635   PT Time Calculation (min) 45 min      Past Medical History  Diagnosis Date  . Diabetes mellitus   . Hypertension   . Arthritis   . Dyslipidemia     No past surgical history on file.  There were no vitals filed for this visit.  Visit Diagnosis:  Unsteadiness on feet  Bilateral low back pain without sciatica  Generalized weakness  Abnormality of gait      Subjective Assessment - 05/05/15 1554    Subjective family member and patient report no c/o pain since last session; family member reports pt simply fell to the chair the other day in therapy, reports not really doing HEP   Currently in Pain? No/denies            [facility] PT Assessment - 05/05/15 0001    Standardized Balance Assessment   Standardized Balance Assessment Berg Balance Test   Berg Balance Test   Sit to Stand Able to stand  independently using hands   Standing Unsupported Able to stand safely 2 minutes   Sitting with Back Unsupported but Feet Supported on Floor or Stool Able to sit safely and securely 2 minutes   Stand to Sit Sits safely with minimal use of hands   Transfers Able to transfer safely, definite need of hands   Standing Unsupported with Eyes Closed Able to stand 10 seconds safely   Standing Ubsupported with Feet Together Able to place feet together independently and stand 1 minute safely   From Standing, Reach Forward with Outstretched Arm Can  reach forward >12 cm safely (5")   From Standing Position, Pick up Object from Floor Able to pick up shoe, needs supervision  does not bend at knees despite cuing   From Standing Position, Turn to Look Behind Over each Shoulder Looks behind from both sides and weight shifts well   Turn 360 Degrees Able to turn 360 degrees safely but slowly   Standing Unsupported, Alternately Place Feet on Step/Stool Able to stand independently and safely and complete 8 steps in 20 seconds   Standing Unsupported, One Foot in Front Able to plae foot ahead of the other independently and hold 30 seconds   Standing on One Leg Able to lift leg independently and hold equal to or more than 3 seconds   Total Score 47                     [facility] Adult PT Treatment/Exercise - 05/05/15 0001    Lumbar Exercises: Aerobic   Stationary Bike Nustep Level 3 LE only x 7 min with cues for quad set   Knee/Hip Exercises: Standing   Hip Extension Right;Left;Stengthening;1 set;10 reps  followed by 10 reps alternating, min vc's   Other Standing Knee Exercises sidestepping with min vc's to keep pelvis neutral 5' x 2 and UE support, attempted to add in partial  squat - pt will not bend knees   Knee/Hip Exercises: Seated   Long Arc Quad Right;Left;1 set;10 reps  vc's to kick, bend, and down   Sit to [employer] 1 set;10 reps  min vc's                PT Education - 05/05/15 1652    Education provided Yes   Education Details HEP - LAQ, sit to stand   Person(s) Educated Patient;Caregiver(s)   Methods Explanation;Demonstration   Comprehension Verbalized understanding          PT Short Term Goals - 05/03/15 2001    PT SHORT TERM GOAL #1   Title independent wtih initial HEP   Time 4   Period Weeks   Status On-going   PT SHORT TERM GOAL #2   Title report pain decrease from 5/10 to 3/10 at rest in low back   Time 4   Period Weeks   Status On-going   PT SHORT TERM GOAL #3   Title Demonstrate understanding of  proper sitting posture and be more conscious of postion and posture athrough out the day.   Period Weeks   Status On-going   PT SHORT TERM GOAL #4   Title BERG balance score improved to 49/56 indicating improved balance   Baseline 46/56   Time 2   Period Weeks   Status Partially Met           PT Long Term Goals - 05/03/15 2002    PT LONG TERM GOAL #1   Title Demonstrate and verbalize techniques to reduce the risk of re injury including  lifting, posture, body mechanics   Time 8   Period Weeks   Status On-going   PT LONG TERM GOAL #2   Title Pt will be able to complete advanced HEP with help of family member and written HEP   Time 8   Period Weeks   Status On-going   PT LONG TERM GOAL #3   Title Pt will be 1/10 or less with all functional activity   Period Weeks   Status On-going   PT LONG TERM GOAL #4   Title FOTO will improve from 48% limitation to at least 40 % to indicate improved functional activity   Time 8   Period Weeks   Status On-going   PT LONG TERM GOAL #5   Title Pt gait velocity will improve to at least 2.62 ft/sec for decrease risk of fall in community   Time 8   Period Weeks   Status On-going               Plan - 05/05/15 1558    Clinical Impression Statement Pt/family report no changes since last visit and no evidence of discomfort from fall. Pt has not had pain this week but also reports she hasn't been out pulling weeds. If no pain reported next visit, STG#2 likely met. Pt not reporting compliance with HEP. Added in seated exercises to try and incr. compliance. Pt unable to fully straighten knees with LAQ and is unable to bend her knees in standing. Sit to stand still with heavy use of UE's. Minimal improvement on Berg (from 46/56 to 47/56). Session performed with close supervision and gait belt due to fall last visit. No evidence of near fall today.  Pt requires extra time throughout exercises due to cognition/dementia.   PT Next Visit Plan  assess STG's, posture and body mechanics activities, quad/hip strengthening  Problem List Patient Active Problem List   Diagnosis Date Noted  . Hyperlipidemia LDL goal <70 07/31/2011  . Hypertension associated with diabetes 07/31/2011  . Diabetes mellitus 07/31/2011  . UNSPECIFIED ANEMIA 09/29/2008    Romualdo Bolk, PT 05/05/2015, 5:03 PM  [place], [place], [address] Phone: [phone]   Fax:  [phone]

## 2015-05-10 ENCOUNTER — Ambulatory Visit: Payer: Medicare Other | Admitting: Physical Therapy

## 2015-05-10 DIAGNOSIS — R531 Weakness: Secondary | ICD-10-CM | POA: Diagnosis not present

## 2015-05-10 DIAGNOSIS — R269 Unspecified abnormalities of gait and mobility: Secondary | ICD-10-CM

## 2015-05-10 DIAGNOSIS — M545 Low back pain, unspecified: Secondary | ICD-10-CM

## 2015-05-10 DIAGNOSIS — R2681 Unsteadiness on feet: Secondary | ICD-10-CM | POA: Diagnosis not present

## 2015-05-10 NOTE — Therapy (Signed)
[place], [place], [address] Phone: [phone]   Fax:  [phone]  Physical Therapy Treatment  Patient Details  Name: Beth Kelly MRN: [id number] Date of Birth: 1934/09/16 Referring Provider:  Denita Lung, MD  Encounter Date: 05/10/2015      PT End of Session - 05/10/15 1022    Visit Number 7   Number of Visits 16   Date for PT Re-Evaluation 05/30/15   Authorization Type Medicare   Authorization Time Period 05-30-15   PT Start Time 1015   PT Stop Time 1100   PT Time Calculation (min) 45 min      Past Medical History  Diagnosis Date  . Diabetes mellitus   . Hypertension   . Arthritis   . Dyslipidemia     No past surgical history on file.  There were no vitals filed for this visit.  Visit Diagnosis:  Unsteadiness on feet  Generalized weakness  Abnormality of gait  Bilateral low back pain without sciatica                       [facility] Adult PT Treatment/Exercise - 05/10/15 0001    Lumbar Exercises: Aerobic   Stationary Bike Nustep L 5 LE only x 5 min   Lumbar Exercises: Supine   Other Supine Lumbar Exercises Pilates Reformer used for foot work (1 red 1 yellow) to engage core and activate quads during parallel  on toes and heels with ball for alignment and cues throughout for neutral spine and abdominal engagement.    Knee/Hip Exercises: Standing   Lateral Step Up Right;Left;1 set;15 reps;Hand Hold: 0;Step Height: 4"   Lateral Step Up Limitations cues to decrease compensations   Other Standing Knee Exercises side stepping in parallel bars x 2 each way, SLS with hip abdct 10 x2 each side with 2 UE support, mini squats with 2 UE support and cues for technique and demonstrates improved ability to bend at knees and hips simultaneously today, sit-stand x 10 with 2 UE support    Knee/Hip Exercises: Seated   Long Arc Quad Right;Left;1 set;10 reps  vc's to kick, bend, and  down   Knee/Hip Flexion march 10 10 x 2 each   Other Seated Knee/Hip Exercises fitter with one black rope for terminal knee extension x 15 each                  PT Short Term Goals - 05/10/15 1111    PT SHORT TERM GOAL #1   Title independent wtih initial HEP   Time 4   Period Weeks   Status On-going   PT SHORT TERM GOAL #2   Title report pain decrease from 5/10 to 3/10 at rest in low back   Time 4   Period Weeks   Status Achieved   PT SHORT TERM GOAL #3   Title Demonstrate understanding of proper sitting posture and be more conscious of postion and posture athrough out the day.   Time 4   Period Weeks   Status Not Met  requires constant cues due to cognition   PT SHORT TERM GOAL #4   Title BERG balance score improved to 49/56 indicating improved balance   Baseline 46/56   Time 2   Period Weeks   Status Partially Met           PT Long Term Goals - 05/03/15 2002    PT LONG TERM GOAL #1  Title Demonstrate and verbalize techniques to reduce the risk of re injury including  lifting, posture, body mechanics   Time 8   Period Weeks   Status On-going   PT LONG TERM GOAL #2   Title Pt will be able to complete advanced HEP with help of family member and written HEP   Time 8   Period Weeks   Status On-going   PT LONG TERM GOAL #3   Title Pt will be 1/10 or less with all functional activity   Period Weeks   Status On-going   PT LONG TERM GOAL #4   Title FOTO will improve from 48% limitation to at least 40 % to indicate improved functional activity   Time 8   Period Weeks   Status On-going   PT LONG TERM GOAL #5   Title Pt gait velocity will improve to at least 2.62 ft/sec for decrease risk of fall in community   Time 8   Period Weeks   Status On-going               Plan - 05/10/15 1105    Clinical Impression Statement STG #2 MET as pt continues to report  no pain. Pt demonstrates improved AROM with knee extension left > right during LAQ. She  requires max cues throughout treatment to decrease compensation from glutes and lumbar extensors during knee exercises. Sit-stands demsonstrate improved technique and  improved ability to bend knees and hips simultaneously to perform mini squats in parallel bars.  She continues to need Bil UE for sit-stands.  Very slow progress in strengthening due to pts learned compensations.    PT Next Visit Plan assess STG's, posture and body mechanics activities, quad/hip strengthening        Problem List Patient Active Problem List   Diagnosis Date Noted  . Hyperlipidemia LDL goal <70 07/31/2011  . Hypertension associated with diabetes 07/31/2011  . Diabetes mellitus 07/31/2011  . UNSPECIFIED ANEMIA 09/29/2008    Dorene Ar, PTA 05/10/2015, 11:13 AM  [facility] [address] [place], [place], [address] Phone: [phone]   Fax:  [phone]

## 2015-05-17 ENCOUNTER — Ambulatory Visit: Payer: Medicare Other | Admitting: Physical Therapy

## 2015-05-17 DIAGNOSIS — R531 Weakness: Secondary | ICD-10-CM

## 2015-05-17 DIAGNOSIS — M545 Low back pain, unspecified: Secondary | ICD-10-CM

## 2015-05-17 DIAGNOSIS — R2681 Unsteadiness on feet: Secondary | ICD-10-CM | POA: Diagnosis not present

## 2015-05-17 DIAGNOSIS — R269 Unspecified abnormalities of gait and mobility: Secondary | ICD-10-CM | POA: Diagnosis not present

## 2015-05-17 NOTE — Therapy (Signed)
[place], [place], [address] Phone: [phone]   Fax:  [phone]  Physical Therapy Treatment  Patient Details  Name: Beth Kelly MRN: [id number] Date of Birth: 02-29-1934 Referring Provider:  Denita Lung, MD  Encounter Date: 05/17/2015      PT End of Session - 05/17/15 1727    Visit Number 8   Number of Visits 16   Date for PT Re-Evaluation 05/30/15   Authorization Type Medicare   Authorization Time Period 05-30-15   PT Start Time 0348   PT Stop Time 0430   PT Time Calculation (min) 42 min      Past Medical History  Diagnosis Date  . Diabetes mellitus   . Hypertension   . Arthritis   . Dyslipidemia     No past surgical history on file.  There were no vitals filed for this visit.  Visit Diagnosis:  Unsteadiness on feet  Generalized weakness  Abnormality of gait  Bilateral low back pain without sciatica          [facility] PT Assessment - 05/17/15 1624    Strength   Right Hip Flexion 4/5   Right Hip ABduction 3/5   Left Hip Flexion 4/5   Left Hip ABduction 3/5   Right Knee Flexion 4-/5   Right Knee Extension --  unable to perform quad set or SAQ -45 in sittting   Left Knee Flexion 4-/5   Left Knee Extension --  unable to perfporm quad set or SAQ                     [facility] Adult PT Treatment/Exercise - 05/17/15 0001    Lumbar Exercises: Aerobic   Stationary Bike Nustep L 4 LE only x 10 min   Lumbar Exercises: Supine   Clam 20 reps   Heel Slides 10 reps   Lumbar Exercises: Sidelying   Clam 10 reps   Hip Abduction 10 reps   Knee/Hip Exercises: Seated   Long Arc Quad Right;Left;1 set;10 reps  vc's to kick, bend, and down   Knee/Hip Flexion march 10 10 x 2 each   Other Seated Knee/Hip Exercises fitter with one black rope for terminal knee extension x 15 each                  PT Short Term Goals - 05/10/15 1111    PT SHORT TERM GOAL #1   Title independent wtih initial HEP   Time 4   Period Weeks   Status On-going   PT SHORT TERM GOAL #2   Title report pain decrease from 5/10 to 3/10 at rest in low back   Time 4   Period Weeks   Status Achieved   PT SHORT TERM GOAL #3   Title Demonstrate understanding of proper sitting posture and be more conscious of postion and posture athrough out the day.   Time 4   Period Weeks   Status Not Met  requires constant cues due to cognition   PT SHORT TERM GOAL #4   Title BERG balance score improved to 49/56 indicating improved balance   Baseline 46/56   Time 2   Period Weeks   Status Partially Met           PT Long Term Goals - 05/03/15 2002    PT LONG TERM GOAL #1   Title Demonstrate and verbalize techniques to reduce the risk of re injury including  lifting,  posture, body mechanics   Time 8   Period Weeks   Status On-going   PT LONG TERM GOAL #2   Title Pt will be able to complete advanced HEP with help of family member and written HEP   Time 8   Period Weeks   Status On-going   PT LONG TERM GOAL #3   Title Pt will be 1/10 or less with all functional activity   Period Weeks   Status On-going   PT LONG TERM GOAL #4   Title FOTO will improve from 48% limitation to at least 40 % to indicate improved functional activity   Time 8   Period Weeks   Status On-going   PT LONG TERM GOAL #5   Title Pt gait velocity will improve to at least 2.62 ft/sec for decrease risk of fall in community   Time 8   Period Weeks   Status On-going               Plan - 05/17/15 1613    Clinical Impression Statement Pt's familty member reports pt refuses to accompany her to the groccery store. She will shop in [place] for 1 hour without difficulty. Pt's care giver has not noticed any changes in her function or ability to use her knees for sit-stand, squatting. She continues to bend at the waist  with gardening. Suggested placing signs in the flower bed that say "DO NOT BEND OVER" but  care giver states she has signs everywhere but the patient does not pay attention to them. Pt demonstrates improved strength in hip flexion and abduction however no change in knee extension strength.    PT Next Visit Plan continue quAD AND HIP STRENGTH, SEE pt TO DISCUSS PROGRESS        Problem List Patient Active Problem List   Diagnosis Date Noted  . Hyperlipidemia LDL goal <70 07/31/2011  . Hypertension associated with diabetes 07/31/2011  . Diabetes mellitus 07/31/2011  . UNSPECIFIED ANEMIA 09/29/2008    Dorene Ar, pta 05/17/2015, 5:34 PM  [facility] [address] [place], [place], [address] Phone: [phone]   Fax:  [phone]

## 2015-05-19 ENCOUNTER — Ambulatory Visit: Payer: Medicare Other | Admitting: Physical Therapy

## 2015-05-24 ENCOUNTER — Ambulatory Visit: Payer: Medicare Other | Attending: Family Medicine | Admitting: Physical Therapy

## 2015-05-24 DIAGNOSIS — R2681 Unsteadiness on feet: Secondary | ICD-10-CM | POA: Diagnosis not present

## 2015-05-24 DIAGNOSIS — R531 Weakness: Secondary | ICD-10-CM | POA: Diagnosis not present

## 2015-05-24 DIAGNOSIS — M545 Low back pain, unspecified: Secondary | ICD-10-CM

## 2015-05-24 DIAGNOSIS — R269 Unspecified abnormalities of gait and mobility: Secondary | ICD-10-CM | POA: Insufficient documentation

## 2015-05-24 NOTE — Patient Instructions (Signed)
Sit to Stand / Stand to Sit / Transfers   Sit on edge of a solid chair with arms, feet flat on floor. Lean forward over feet and stand up with hands on chair arms. Sit down slowly with hands on chair arms. Repeat _10___ times per session. Do _5___ sessions per day.  Copyright  VHI. All rights reserved.

## 2015-05-24 NOTE — Therapy (Signed)
[facility] [address] [place], [place], [address] Phone: [phone]   Fax:  [phone]  Physical Therapy Treatment  Patient Details  Name: Beth Kelly MRN: [id number] Date of Birth: Jan 14, 1934 Referring Provider:  Ronnald Nian, MD  Encounter Date: 05/24/2015      PT End of Session - 05/24/15 1711    Visit Number 9   Number of Visits 16   Date for PT Re-Evaluation 05/30/15   Authorization Type Medicare   Authorization Time Period 05-30-15   PT Start Time 0345   PT Stop Time 0430   PT Time Calculation (min) 45 min   Activity Tolerance Patient tolerated treatment well   Behavior During Therapy [facility] for tasks assessed/performed      Past Medical History  Diagnosis Date  . Diabetes mellitus   . Hypertension   . Arthritis   . Dyslipidemia     No past surgical history on file.  There were no vitals filed for this visit.  Visit Diagnosis:  Unsteadiness on feet  Generalized weakness  Abnormality of gait  Bilateral low back pain without sciatica      Subjective Assessment - 05/24/15 1551    Subjective family memeber and pt report no c/p pain.  Pt sees Dr. Susann Givens tomorrow.  I am pulling weeds in my yard but I am not doing my exercise as much and family member concurs.     Patient is accompained by: Family member   Pertinent History no surgeries on back   Limitations House hold activities   Currently in Pain? No/denies            [facility] PT Assessment - 05/24/15 1656    AROM   Lumbar Flexion 90  position for pulling weeds pt told better po   Lumbar Extension 20   Lumbar - Right Side Bend 45   Lumbar - Left Side Bend 45   Strength   Right Hip Flexion 4/5   Right Hip ABduction 3/5   Left Hip Flexion 4/5   Left Hip ABduction 3/5   Right Knee Flexion 4-/5   Right Knee Extension --  unable to perform quad set or SAQ -45 in sittting   Left Knee Flexion 4-/5   Left Knee Extension --  unable to  perfporm quad set or SAQ                     [facility] Adult PT Treatment/Exercise - 05/24/15 1656    Ambulation/Gait   Gait velocity 2.82 ft/sec with cane   Ramp 6: Modified independent (Device)   Lumbar Exercises: Aerobic   Stationary Bike Nustep L 4 LE only x 10 min   Lumbar Exercises: Supine   Clam 20 reps   Heel Slides 10 reps   Bridge 10 reps   Bridge Limitations Pt assisted with sheet under buttocks to fully lift hips without pelvic tilt   Knee/Hip Exercises: Standing   Lateral Step Up Right;Left;1 set;15 reps;Hand Hold: 0;Step Height: 4"   Lateral Step Up Limitations cues to decrease compensations   Forward Step Up Right;Left;1 set;15 reps;Hand Hold: 2;Step Height: 4"   Forward Step Up Limitations cues to decrease compensations   Other Standing Knee Exercises sit to stand with hand hold on chair.  with VC/TC for correct pelvic movement and decreasing locking of knees against high low table for compensation.  20 x   Knee/Hip Exercises: Seated   Long Arc Quad Right;Left;1 set;10 reps  vc's to kick, bend, and down                PT Education - 05/24/15 1610    Education provided Yes          PT Short Term Goals - 05/24/15 1601    PT SHORT TERM GOAL #1   Title independent wtih initial HEP   Baseline Pt states she knows exercises but does not always do them   Time 4   Period Weeks   Status Partially Met   PT SHORT TERM GOAL #2   Title report pain decrease from 5/10 to 3/10 at rest in low back   Baseline 0/10   Time 4   Period Weeks   PT SHORT TERM GOAL #3   Title Demonstrate understanding of proper sitting posture and be more conscious of postion and posture athrough out the day.   Time 4   Period Weeks   Status Not Met           PT Long Term Goals - 05/24/15 1612    PT LONG TERM GOAL #1   Title Demonstrate and verbalize techniques to reduce the risk of re injury including  lifting, posture, body mechanics   Baseline must be cued be  caregiver   Time 8   Period Weeks   Status Partially Met   PT LONG TERM GOAL #2   Title Pt will be able to complete advanced HEP with help of family member and written HEP   Time 8   Period Weeks   Status On-going   PT LONG TERM GOAL #3   Title Pt will be 1/10 or less with all functional activity   Baseline Pt is intermittent and occassionally   Time 8   Period Weeks   Status Partially Met   PT LONG TERM GOAL #4   Title FOTO will improve from 48% limitation to at least 40 % to indicate improved functional activity   Time 8   Period Weeks   Status Unable to assess   PT LONG TERM GOAL #5   Title Pt gait velocity will improve to at least 2.62 ft/sec for decrease risk of fall in community   Baseline improved to 2.82 Ft/sec   Time 8   Period Weeks   Status On-going   PT LONG TERM GOAL #6   Title Pt will be able shop for groceries for at least 30 minutes without leaving due to back pain.    Baseline Pt can now shop for 30 minutes  At [employer]   Time 8   Period Weeks   Status Achieved               Plan - 05/24/15 1615    Clinical Impression Statement Pt family memeber reports pt does not do home exercises but she does pull weeds while bending over and locking knees.  ( Poor body mechanics.)  Pt is experiencing no pain as per caregiver /pt.  but occassionally will have hip pain.  Pt increased her gait velocity to 2.82 ft/sec and achieved LTG.   Pt will need cuing for HEP at home from caregiver to complete exercise program.  Pt  still  has  improved strength in hip flex and abduction but knee extension strength is unchanged.   Pt has 3 additional appt scheduled.  Pt needs to improve strength but doubt she will carry over with Home program.  Will encourage progressive knee/hip strength using steps and sit to  stand for 3 additional visits before DC and work torward achieving LTGs   Pt will benefit from skilled therapeutic intervention in order to improve on the following deficits  Abnormal gait;Decreased activity tolerance;Decreased balance;Decreased mobility;Decreased endurance;Difficulty walking;Increased muscle spasms;Improper body mechanics;Postural dysfunction;Pain;Decreased safety awareness;Decreased range of motion   Rehab Potential Good   PT Frequency 2x / week   PT Duration 8 weeks   PT Treatment/Interventions ADLs/Self Care Home Management;Cryotherapy;Electrical Stimulation;Gait training;Stair training;Moist Heat;Iontophoresis 4mg /ml Dexamethasone;Balance training;Therapeutic exercise;Functional mobility training;Neuromuscular re-education;Manual techniques;Patient/family education   PT Next Visit Plan Do FOTO next visit for 10th visit. Pt to see Dr. Redmond School tomorrow and progress not sent        Problem List Patient Active Problem List   Diagnosis Date Noted  . Hyperlipidemia LDL goal <70 07/31/2011  . Hypertension associated with diabetes 07/31/2011  . Diabetes mellitus 07/31/2011  . UNSPECIFIED ANEMIA 09/29/2008    Voncille Lo, PT 05/24/2015 5:12 PM Phone: [phone] Fax: [facility] [place], [place], [address] Phone: [phone]   Fax:  [phone]   Physical Therapy Progress Note  Dates of Reporting Period: 04-04-15 to 05-30-15  Objective Reports of Subjective Statement: Pt has no pain in hip.  Denies.  Pt able to shop with caregiver for at least 30 minutes at store  Objective Measurements:      [facility] PT Assessment - 05/24/15 1656    AROM   Lumbar Flexion 90  position for pulling weeds pt told better po   Lumbar Extension 20   Lumbar - Right Side Bend 45   Lumbar - Left Side Bend 45   Strength   Right Hip Flexion 4/5   Right Hip ABduction 3/5   Left Hip Flexion 4/5   Left Hip ABduction 3/5   Right Knee Flexion 4-/5   Right Knee Extension --  unable to perform quad set or SAQ -45 in sittting   Left Knee Flexion 4-/5   Left Knee Extension --   unable to perfporm quad set or SAQ      Goal Update: See above  Plan: Continue for 3 additional visits  Reason Skilled Services are Required: Pt requires skilled PT for posture/gait,knee extension bilaterally weakness and LE as indicated above.  Pt requires constant cues due to cognition and caregiver is present for consistency of home exercise program as it is advanced in remaining visits  Voncille Lo, PT 05/24/2015 5:17 PM Phone: [phone] Fax: [phone]

## 2015-05-25 ENCOUNTER — Ambulatory Visit (INDEPENDENT_AMBULATORY_CARE_PROVIDER_SITE_OTHER): Payer: Medicare Other | Admitting: Family Medicine

## 2015-05-25 ENCOUNTER — Encounter: Payer: Self-pay | Admitting: Family Medicine

## 2015-05-25 VITALS — BP 150/70 | HR 80 | Wt 120.0 lb

## 2015-05-25 DIAGNOSIS — Z23 Encounter for immunization: Secondary | ICD-10-CM

## 2015-05-25 DIAGNOSIS — E1169 Type 2 diabetes mellitus with other specified complication: Secondary | ICD-10-CM | POA: Diagnosis not present

## 2015-05-25 DIAGNOSIS — I10 Essential (primary) hypertension: Secondary | ICD-10-CM | POA: Diagnosis not present

## 2015-05-25 DIAGNOSIS — E785 Hyperlipidemia, unspecified: Secondary | ICD-10-CM | POA: Diagnosis not present

## 2015-05-25 DIAGNOSIS — E119 Type 2 diabetes mellitus without complications: Secondary | ICD-10-CM

## 2015-05-25 DIAGNOSIS — E1159 Type 2 diabetes mellitus with other circulatory complications: Secondary | ICD-10-CM

## 2015-05-25 LAB — CBC WITH DIFFERENTIAL/PLATELET
BASOS ABS: 0 10*3/uL (ref 0.0–0.1)
Basophils Relative: 1 % (ref 0–1)
EOS PCT: 1 % (ref 0–5)
Eosinophils Absolute: 0 10*3/uL (ref 0.0–0.7)
HEMATOCRIT: 35.8 % — AB (ref 36.0–46.0)
HEMOGLOBIN: 11.7 g/dL — AB (ref 12.0–15.0)
LYMPHS ABS: 1.4 10*3/uL (ref 0.7–4.0)
LYMPHS PCT: 34 % (ref 12–46)
MCH: 28.9 pg (ref 26.0–34.0)
MCHC: 32.7 g/dL (ref 30.0–36.0)
MCV: 88.4 fL (ref 78.0–100.0)
MONO ABS: 0.5 10*3/uL (ref 0.1–1.0)
MPV: 10.7 fL (ref 8.6–12.4)
Monocytes Relative: 12 % (ref 3–12)
NEUTROS ABS: 2.2 10*3/uL (ref 1.7–7.7)
Neutrophils Relative %: 52 % (ref 43–77)
Platelets: 203 10*3/uL (ref 150–400)
RBC: 4.05 MIL/uL (ref 3.87–5.11)
RDW: 14.8 % (ref 11.5–15.5)
WBC: 4.2 10*3/uL (ref 4.0–10.5)

## 2015-05-25 LAB — COMPREHENSIVE METABOLIC PANEL
ALT: 10 U/L (ref 6–29)
AST: 20 U/L (ref 10–35)
Albumin: 3.8 g/dL (ref 3.6–5.1)
Alkaline Phosphatase: 67 U/L (ref 33–130)
BILIRUBIN TOTAL: 0.6 mg/dL (ref 0.2–1.2)
BUN: 14 mg/dL (ref 7–25)
CALCIUM: 8.9 mg/dL (ref 8.6–10.4)
CO2: 30 mmol/L (ref 20–31)
Chloride: 102 mmol/L (ref 98–110)
Creat: 0.59 mg/dL — ABNORMAL LOW (ref 0.60–0.93)
GLUCOSE: 189 mg/dL — AB (ref 65–99)
POTASSIUM: 4 mmol/L (ref 3.5–5.3)
Sodium: 138 mmol/L (ref 135–146)
Total Protein: 6.8 g/dL (ref 6.1–8.1)

## 2015-05-25 LAB — LIPID PANEL
CHOL/HDL RATIO: 2.7 ratio (ref <=5.0)
CHOLESTEROL: 226 mg/dL — AB (ref 125–200)
HDL: 84 mg/dL (ref >=46)
LDL Cholesterol: 127 mg/dL (ref <130)
Triglycerides: 74 mg/dL (ref <150)
VLDL: 15 mg/dL (ref <30)

## 2015-05-25 LAB — POCT GLYCOSYLATED HEMOGLOBIN (HGB A1C): Hemoglobin A1C: 6.5

## 2015-05-25 MED ORDER — AMLODIPINE BESYLATE 10 MG PO TABS: 10.0000 mg | ORAL_TABLET | Freq: Every day | ORAL | Status: DC

## 2015-05-25 MED ORDER — ATORVASTATIN CALCIUM 20 MG PO TABS: 20.0000 mg | ORAL_TABLET | Freq: Every day | ORAL | Status: DC

## 2015-05-25 MED ORDER — METFORMIN HCL 850 MG PO TABS: ORAL_TABLET | ORAL | Status: DC

## 2015-05-25 MED ORDER — LISINOPRIL-HYDROCHLOROTHIAZIDE 20-12.5 MG PO TABS: 1.0000 | ORAL_TABLET | Freq: Every day | ORAL | Status: DC

## 2015-05-25 NOTE — Progress Notes (Signed)
   Subjective:    Patient ID: Beth Kelly, female    DOB: June 09, 1934, 81 y.o.   MRN: [id number]  HPI She is here for a recheck. She has been involved in physical therapy to help with overall gait instability. She is making some progress in that regard. She does have underlying diabetes.However does not check her blood sugars. She is going to see her eye doctor within the next month. She does check her feet periodically. Her diet is unchanged. She does cook her own food and does live alone.She does not smoke anymore and not drink. He does keep himself physically active doing yard work. Her medications were reviewed and will be renewed.She is considering assisted living.   Review of Systems     Objective:   Physical Exam Alert and in no distress. Foot exam is normal. Hemoglobin A1c is 6.5.       Assessment & Plan:  Need for prophylactic vaccination against Streptococcus pneumoniae (pneumococcus) - Plan: Pneumococcal conjugate vaccine 13-valent  Need for prophylactic vaccination and inoculation against influenza - Plan: Flu vaccine HIGH DOSE PF (Fluzone High dose)  Hyperlipidemia LDL goal <70 - Plan: Lipid panel, atorvastatin (LIPITOR) 20 MG tablet  Hypertension associated with diabetes - Plan: CBC with Differential/Platelet, Comprehensive metabolic panel, lisinopril-hydrochlorothiazide (ZESTORETIC) 20-12.5 MG per tablet, amLODipine (NORVASC) 10 MG tablet  Type 2 diabetes mellitus without complication - Plan: CBC with Differential/Platelet, Comprehensive metabolic panel, Lipid panel, metFORMIN (GLUCOPHAGE) 850 MG tablet  She is considering assisted living and I strongly encouraged her to get involved with that. Check here in 4 months.

## 2015-05-26 ENCOUNTER — Ambulatory Visit: Payer: Medicare Other | Admitting: Physical Therapy

## 2015-05-26 DIAGNOSIS — R2681 Unsteadiness on feet: Secondary | ICD-10-CM | POA: Diagnosis not present

## 2015-05-26 DIAGNOSIS — R269 Unspecified abnormalities of gait and mobility: Secondary | ICD-10-CM

## 2015-05-26 DIAGNOSIS — M545 Low back pain, unspecified: Secondary | ICD-10-CM

## 2015-05-26 DIAGNOSIS — R531 Weakness: Secondary | ICD-10-CM | POA: Diagnosis not present

## 2015-05-26 NOTE — Therapy (Addendum)
[place], [place], [address] Phone: [phone]   Fax:  [phone]  Physical Therapy Treatment  Patient Details  Name: Beth Kelly MRN: [id number] Date of Birth: 1933/10/18 Referring Provider:  Denita Lung, MD  Encounter Date: 05/26/2015      PT End of Session - 05/26/15 1502    Visit Number 10   Number of Visits 16   Date for PT Re-Evaluation 05/30/15   Authorization Type Medicare   PT Start Time 0215   PT Stop Time 0300   PT Time Calculation (min) 45 min      Past Medical History  Diagnosis Date  . Diabetes mellitus   . Hypertension   . Arthritis   . Dyslipidemia     No past surgical history on file.  There were no vitals filed for this visit.  Visit Diagnosis:  No diagnosis found.          [facility] PT Assessment - 05/26/15 1451    Observation/Other Assessments   Focus on Therapeutic Outcomes (FOTO)  56% limited (48% limited on initial eval)                      [facility] Adult PT Treatment/Exercise - 05/26/15 1447    Lumbar Exercises: Aerobic   Stationary Bike Nustep L 4 x 8 min   Lumbar Exercises: Supine   Heel Slides 10 reps   Bridge 10 reps   Bridge Limitations Pt assisted with sheet under buttocks to fully lift hips without pelvic tilt   Knee/Hip Exercises: Standing   Forward Step Up Right;Left;1 set;15 reps;Hand Hold: 2;Hand Hold: 1;Step Height: 6"   Forward Step Up Limitations cues to decrease compensations   Step Down 2 sets;10 reps;Hand Hold: 2;Step Height: 4"   Step Down Limitations cues to decrease compensations   Other Standing Knee Exercises sit to stand with hand hold on chair.  with VC/TC for correct pelvic movement and decreasing locking of knees against high low table for compensation.  20 x   Knee/Hip Exercises: Seated   Long Arc Quad Right;Left;1 set;10 reps  vc's to kick, bend, and down   Clamshell with Thera[employer] Green  x 20                   PT Short Term Goals - 05/24/15 1601    PT SHORT TERM GOAL #1   Title independent wtih initial HEP   Baseline Pt states she knows exercises but does not always do them   Time 4   Period Weeks   Status Partially Met   PT SHORT TERM GOAL #2   Title report pain decrease from 5/10 to 3/10 at rest in low back   Baseline 0/10   Time 4   Period Weeks   PT SHORT TERM GOAL #3   Title Demonstrate understanding of proper sitting posture and be more conscious of postion and posture athrough out the day.   Time 4   Period Weeks   Status Not Met           PT Long Term Goals - 05/24/15 1612    PT LONG TERM GOAL #1   Title Demonstrate and verbalize techniques to reduce the risk of re injury including  lifting, posture, body mechanics   Baseline must be cued be caregiver   Time 8   Period Weeks   Status Partially Met   PT LONG TERM GOAL #2  Title Pt will be able to complete advanced HEP with help of family member and written HEP   Time 8   Period Weeks   Status On-going   PT LONG TERM GOAL #3   Title Pt will be 1/10 or less with all functional activity   Baseline Pt is intermittent and occassionally   Time 8   Period Weeks   Status Partially Met   PT LONG TERM GOAL #4   Title FOTO will improve from 48% limitation to at least 40 % to indicate improved functional activity   Time 8   Period Weeks   Status Unable to assess   PT LONG TERM GOAL #5   Title Pt gait velocity will improve to at least 2.62 ft/sec for decrease risk of fall in community   Baseline improved to 2.82 Ft/sec   Time 8   Period Weeks   Status On-going   PT LONG TERM GOAL #6   Title Pt will be able shop for groceries for at least 30 minutes without leaving due to back pain.    Baseline Pt can now shop for 30 minutes  At [employer]   Time 8   Period Weeks   Status Achieved        G code Mobility  Current 56%CK  Eval 48% CK  Goal CJ         Plan - 05/26/15 1456    Clinical  Impression Statement Pt family member reports pt's MD agreed with her moving into assisted living and her family is planning to discuss options. Per PT request, focused today on step ups and sit-stand for Knee strengthening. FOTO outcome measure completed by family member at intake and statue. It demonstrates a decreasein function by 8%. One more visit.    PT Next Visit Plan Discharge next visit, remove from [place] ( no need to repeat Foto)         Problem List Patient Active Problem List   Diagnosis Date Noted  . Hyperlipidemia LDL goal <70 07/31/2011  . Hypertension associated with diabetes 07/31/2011  . Diabetes mellitus 07/31/2011  . UNSPECIFIED ANEMIA 09/29/2008    Dorene Ar, PTA 05/26/2015, 3:03 PM  [facility] [address] [place], [place], [address] Phone: [phone]   Fax:  [phone]   Voncille Lo, PT 05/30/2015 11:59 AM Phone: [phone] Fax: [phone]

## 2015-05-31 ENCOUNTER — Ambulatory Visit: Payer: Medicare Other | Admitting: Physical Therapy

## 2015-05-31 DIAGNOSIS — R2681 Unsteadiness on feet: Secondary | ICD-10-CM | POA: Diagnosis not present

## 2015-05-31 DIAGNOSIS — M545 Low back pain, unspecified: Secondary | ICD-10-CM

## 2015-05-31 DIAGNOSIS — R269 Unspecified abnormalities of gait and mobility: Secondary | ICD-10-CM

## 2015-05-31 DIAGNOSIS — R531 Weakness: Secondary | ICD-10-CM | POA: Diagnosis not present

## 2015-05-31 NOTE — Therapy (Addendum)
[place], [place], [address] Phone: [phone]   Fax:  [phone]  Physical Therapy Treatment/Discharge  Patient Details  Name: Beth Kelly MRN: [id number] Date of Birth: 1934/08/27 Referring Provider:  Denita Lung, MD  Encounter Date: 05/31/2015      PT End of Session - 05/31/15 1548    Visit Number 11   Number of Visits 16   Date for PT Re-Evaluation 05/30/15   Authorization Type Medicare   PT Start Time 0312   PT Stop Time 0350   PT Time Calculation (min) 38 min   Activity Tolerance Patient tolerated treatment well   Behavior During Therapy [facility] for tasks assessed/performed      Past Medical History  Diagnosis Date  . Diabetes mellitus   . Hypertension   . Arthritis   . Dyslipidemia     No past surgical history on file.  There were no vitals filed for this visit.  Visit Diagnosis:  Unsteadiness on feet  Generalized weakness  Abnormality of gait  Bilateral low back pain without sciatica      Subjective Assessment - 05/31/15 1522    Subjective Pt /family stated  no c/p pain.  Pt saw Dr. Redmond School and pt will be talking with sons about assisted living for fall prevention and safety.  Caregiver states pt does not follow through with exercises   Patient is accompained by: Family member   Pertinent History no surgeries on back   Limitations House hold activities   How long can you sit comfortably? unlimited   How long can you stand comfortably? 30 min max  before hip begins hurting   How long can you walk comfortably? 20 min   Patient Stated Goals control pain, get stronger   Currently in Pain? No/denies            [facility] PT Assessment - 05/31/15 1538    AROM   Lumbar Flexion 90  position for pulling weeds pt told bend knees but habitual   Lumbar Extension 20   Lumbar - Right Side Bend 45   Lumbar - Left Side Bend 45   Strength   Right Hip Flexion 4/5   Right Hip  ABduction 3/5   Left Hip Flexion 4/5   Left Hip ABduction 3/5   Right Knee Flexion 4-/5   Right Knee Extension --  unable to perform quad set or SAQ -45 in sittting   Left Knee Flexion 4-/5   Left Knee Extension --  unable to perfporm quad set or SAQ   Ambulation/Gait   Gait velocity 2.2f/sec  no change from previous and uses cane   Ramp 6: Modified independent (Device)  cane   Gait Comments Pt walks with cane and stiff /compensations. locked knee and hips forward leading   Berg Balance Test   Sit to Stand Able to stand  independently using hands   Standing Unsupported Able to stand safely 2 minutes   Sitting with Back Unsupported but Feet Supported on Floor or Stool Able to sit safely and securely 2 minutes   Stand to Sit Sits safely with minimal use of hands   Transfers Able to transfer safely, definite need of hands   Standing Unsupported with Eyes Closed Able to stand 10 seconds safely   Standing Ubsupported with Feet Together Able to place feet together independently and stand for 1 minute with supervision   From Standing, Reach Forward with Outstretched Arm Can reach forward >5  cm safely (2")   From Standing Position, Pick up Object from [place] to pick up shoe, needs supervision   From Standing Position, Turn to Look Behind Over each Shoulder Looks behind one side only/other side shows less weight shift  does not bend knees and uses extra time   Turn 360 Degrees Able to turn 360 degrees safely but slowly   Standing Unsupported, Alternately Place Feet on Step/Stool Able to stand independently and complete 8 steps >20 seconds   Standing Unsupported, One Foot in [place] to take small step independently and hold 30 seconds   Standing on One Leg Able to lift leg independently and hold equal to or more than 3 seconds   Total Score 42                     [facility] Adult PT Treatment/Exercise - 05/31/15 1538    Lumbar Exercises: Supine   Clam 20 reps   Bridge 10  reps   Bridge Limitations Pt assisted , pt with poor lumbo sacral rhythym   Straight Leg Raise 5 reps;3 seconds   Straight Leg Raises Limitations requires verbal cues and eccentric help from PT,     Other Supine Lumbar Exercises reviewed HEP from former visits with caregiver and answered questions about assisted living                PT Education - 05/31/15 1735    Education provided Yes   Education Details reviewed former HEP from previous visits,  BERG explanation 42/56   Person(s) Educated Associate Professor)   Methods Explanation;Demonstration;Handout   Comprehension Other (comment);Verbalized understanding;Returned demonstration  Pt is non compliant with HEP and is at risk of fall living on her own.          PT Short Term Goals - 05/31/15 1738    PT SHORT TERM GOAL #1   Title independent wtih initial HEP   Baseline Pt states she knows exercises but does not always do them   Time 4   Period Weeks   Status Partially Met   PT SHORT TERM GOAL #2   Title report pain decrease from 5/10 to 3/10 at rest in low back   Baseline 0/10   Time 4   Period Weeks   Status Achieved   PT SHORT TERM GOAL #3   Title Demonstrate understanding of proper sitting posture and be more conscious of postion and posture athrough out the day.   Time 4   Period Weeks   Status Not Met   PT SHORT TERM GOAL #4   Title BERG balance score improved to 49/56 indicating improved balance   Baseline 42/56   Time 2   Period Weeks   Status Not Met           PT Long Term Goals - 05/31/15 1738    PT LONG TERM GOAL #1   Title Demonstrate and verbalize techniques to reduce the risk of re injury including  lifting, posture, body mechanics   Baseline must be cued be caregiver   Time 8   Period Weeks   Status Partially Met   PT LONG TERM GOAL #2   Title Pt will be able to complete advanced HEP with help of family member and written HEP   Time 8   Period Weeks   Status Partially Met   PT LONG  TERM GOAL #3   Title Pt will be 1/10 or less with all functional activity  Baseline 0/10 during cliinc but caregiver comments that hip hurts her after she standis for 30 minutes   Time 8   Period Weeks   Status Partially Met   PT LONG TERM GOAL #4   Title FOTO will improve from 48% limitation to at least 40 % to indicate improved functional activity   Baseline 56% limited  48% on eval   Time 8   Period Weeks   Status Not Met   PT LONG TERM GOAL #5   Title Pt gait velocity will improve to at least 2.62 ft/sec for decrease risk of fall in community   Baseline improved to 2.82 Ft/sec   Time 8   Period Weeks   Status On-going   PT LONG TERM GOAL #6   Title Pt will be able shop for groceries for at least 30 minutes without leaving due to back pain.    Baseline Pt can now shop for 30 minutes  At [employer] but she sits during the shopping   Time 8   Period Weeks   Status Achieved               Plan - 07-Jun-2015 1530    Clinical Impression Statement  Ms. Avitia is a very pleasant and delightful pt that is not consistent with exercise program and has exhibited decline in BERG balance scale.  Pt has LE weakness that has not improved significantly since begininning of PT.  Pt with decreased lumbo sacral rhythm. and uses compensation like blocking knees and bending forward in order to rise and stand.   Pt has not shown significant improvement and has declined in steadiness with 42/56 today for BERG balance score.  Pt  and caregiver were instructed that using a walker is more stable than a cane and  advised  to use a walker .  Pt also cautianed that  strengthening is imperative if she is to remain walking  independently.  Pt reports no pain during clinic time but caregiver reports her hip hurts at times if she stands too long on it.  PT /pt and caregiver discussed the advantages of being in assisted living socially and having access to exercise and  caregiving for prevention of falls .     Pt will  benefit from skilled therapeutic intervention in order to improve on the following deficits Abnormal gait;Decreased activity tolerance;Decreased balance;Decreased mobility;Decreased endurance;Difficulty walking;Increased muscle spasms;Improper body mechanics;Postural dysfunction;Pain;Decreased safety awareness;Decreased range of motion   Rehab Potential Good   Clinical Impairments Affecting Rehab Potential cognitive impairment and  non compliance with exercise   PT Frequency 2x / week   PT Next Visit Plan Discharge   Consulted and Agree with Plan of Care Patient;Family member/caregiver          G-Codes - 2015-06-07 1741    Functional Assessment Tool Used FOTO   Functional Limitation Mobility: Walking and moving around   Mobility: Walking and Moving Around Goal Status [id number]) At least 20 percent but less than 40 percent impaired, limited or restricted   Mobility: Walking and Moving Around Discharge Status [phone]) At least 40 percent but less than 60 percent impaired, limited or restricted      Problem List Patient Active Problem List   Diagnosis Date Noted  . Hyperlipidemia LDL goal <70 07/31/2011  . Hypertension associated with diabetes 07/31/2011  . Diabetes mellitus 07/31/2011  . UNSPECIFIED ANEMIA 09/29/2008    Voncille Lo, PT 2015-06-07 5:48 PM Phone: [phone] Fax: [phone]  [facility]  [place], [place], [address] Phone: [phone]   Fax:  [phone]   PHYSICAL THERAPY DISCHARGE SUMMARY  Visits from Start of Care: 11  Current functional level related to goals / functional outcomes: See above  Pt not making significant gains and requiring assisted living for fall prevention due to weakness.  Family will continue discussion with Dr. Redmond School  Remaining deficits: BERG is 42/56  Recommended using walker instead of cane for safety.  Pt unable to complete quad set and requires cueing to do and  finish exercises.  Pt non compliant with HEP   Education / Equipment: HEP explained to caregiver and pt Plan: Patient agrees to discharge.  Patient goals were partially met. Patient is being discharged due to lack of progress.  ?????       Voncille Lo, PT 05/31/2015 5:52 PM Phone: [phone] Fax: [phone]

## 2015-05-31 NOTE — Patient Instructions (Signed)
REviewed HEP from former visits

## 2015-06-02 ENCOUNTER — Encounter: Payer: Medicare Other | Admitting: Physical Therapy

## 2015-06-02 DIAGNOSIS — E1151 Type 2 diabetes mellitus with diabetic peripheral angiopathy without gangrene: Secondary | ICD-10-CM | POA: Diagnosis not present

## 2015-06-02 DIAGNOSIS — L603 Nail dystrophy: Secondary | ICD-10-CM | POA: Diagnosis not present

## 2015-06-02 DIAGNOSIS — I739 Peripheral vascular disease, unspecified: Secondary | ICD-10-CM | POA: Diagnosis not present

## 2015-06-13 ENCOUNTER — Other Ambulatory Visit: Payer: Self-pay | Admitting: Family Medicine

## 2015-06-23 ENCOUNTER — Other Ambulatory Visit: Payer: Self-pay

## 2015-06-23 ENCOUNTER — Telehealth: Payer: Self-pay | Admitting: Family Medicine

## 2015-06-23 NOTE — Telephone Encounter (Signed)
CALLED AND TALKED WITH PHARMACIST [facility] HAVE NOT BEEN PICKED UP SINCE April HE WAS FILLING THE MEDS NOW LEFT MESSAGE FOR [place]

## 2015-06-23 NOTE — Telephone Encounter (Signed)
These were all called in in September. See if you can work with the pharmacy to give her refill. She probably threw them out

## 2015-06-23 NOTE — Telephone Encounter (Signed)
Pierrette Harrel caretaker Thornell Sartorius called and said that the pt was out of her RX lipitor,norvasc,zestoretic, said she wasn't for sure if she threw them all out, all the bottles was empty, says she has dementia, was wondering if there was anyway possible if you could send her any refills for them, [facility] [address], [place] can be reached at [phone] with any questions,

## 2015-07-07 ENCOUNTER — Telehealth: Payer: Self-pay

## 2015-07-07 NOTE — Telephone Encounter (Signed)
Received fax saying Buffi needed orthotic braces. I called and talked to the caregiver and she clarified that Shaleen doesn't need the braces. I sent the fax back telling them she didn't need them.

## 2015-07-20 ENCOUNTER — Telehealth: Payer: Self-pay

## 2015-07-20 ENCOUNTER — Other Ambulatory Visit: Payer: Self-pay

## 2015-07-20 DIAGNOSIS — E119 Type 2 diabetes mellitus without complications: Secondary | ICD-10-CM

## 2015-07-20 MED ORDER — METFORMIN HCL 850 MG PO TABS: ORAL_TABLET | ORAL | Status: DC

## 2015-07-20 NOTE — Telephone Encounter (Signed)
Med sheet has 2 different metformin prescriptions. Please help clear this up

## 2015-07-20 NOTE — Telephone Encounter (Signed)
Refill request for Metformin HCL 850mg  #90

## 2015-07-21 DIAGNOSIS — E119 Type 2 diabetes mellitus without complications: Secondary | ICD-10-CM | POA: Diagnosis not present

## 2015-07-21 DIAGNOSIS — H25813 Combined forms of age-related cataract, bilateral: Secondary | ICD-10-CM | POA: Diagnosis not present

## 2015-07-21 LAB — HM DIABETES EYE EXAM

## 2015-07-26 ENCOUNTER — Encounter: Payer: Self-pay | Admitting: Family Medicine

## 2015-08-30 DIAGNOSIS — E1142 Type 2 diabetes mellitus with diabetic polyneuropathy: Secondary | ICD-10-CM | POA: Diagnosis not present

## 2015-09-26 ENCOUNTER — Ambulatory Visit: Payer: Self-pay | Admitting: Family Medicine

## 2015-09-27 ENCOUNTER — Ambulatory Visit: Payer: Medicare Other | Admitting: Family Medicine

## 2015-10-07 ENCOUNTER — Ambulatory Visit: Payer: Medicare Other | Admitting: Family Medicine

## 2015-10-12 ENCOUNTER — Ambulatory Visit (INDEPENDENT_AMBULATORY_CARE_PROVIDER_SITE_OTHER): Payer: Medicare Other | Admitting: Family Medicine

## 2015-10-12 ENCOUNTER — Encounter: Payer: Self-pay | Admitting: Family Medicine

## 2015-10-12 VITALS — BP 150/74 | HR 87 | Wt 128.0 lb

## 2015-10-12 DIAGNOSIS — Z9119 Patient's noncompliance with other medical treatment and regimen: Secondary | ICD-10-CM | POA: Diagnosis not present

## 2015-10-12 DIAGNOSIS — I10 Essential (primary) hypertension: Secondary | ICD-10-CM | POA: Diagnosis not present

## 2015-10-12 DIAGNOSIS — Z91199 Patient's noncompliance with other medical treatment and regimen due to unspecified reason: Secondary | ICD-10-CM

## 2015-10-12 DIAGNOSIS — E1159 Type 2 diabetes mellitus with other circulatory complications: Secondary | ICD-10-CM | POA: Diagnosis not present

## 2015-10-12 DIAGNOSIS — E1169 Type 2 diabetes mellitus with other specified complication: Secondary | ICD-10-CM | POA: Diagnosis not present

## 2015-10-12 DIAGNOSIS — E785 Hyperlipidemia, unspecified: Secondary | ICD-10-CM

## 2015-10-12 DIAGNOSIS — I152 Hypertension secondary to endocrine disorders: Secondary | ICD-10-CM

## 2015-10-12 LAB — POCT GLYCOSYLATED HEMOGLOBIN (HGB A1C): HEMOGLOBIN A1C: 7

## 2015-10-12 NOTE — Patient Instructions (Signed)
You agreed to look into assisted living. Start looking around. Get somebody to help you use your pil box and use it

## 2015-10-12 NOTE — Progress Notes (Signed)
Patient ID: Beth Kelly, female   DOB: Jun 30, 1934, 82 y.o.   MRN: [id number]  Subjective:    Patient ID: Beth Kelly, female    DOB: 23-Jul-1934, 82 y.o.   MRN: [id number]  Beth Kelly is a 82 y.o. female who presents for follow-up of Type 2 diabetes mellitus.She told my nurse that she is not taking medications appropriately however further questioning then indicated that she wasn't sure whether she is taking them correctly or not. She does have a friend help her and again the lady mentions difficult time getting her to take medicines as appropriately prescribed.  Patient is not checking home blood sugars.   Home blood sugar records: patient does not check sugars How often is blood sugars being checked: not Current symptoms/problems include none and have been stable. Daily foot checks: Podiatrist Dr Samuella Cota  Any foot concerns: None Last eye exam: Yearly Dr Dione Booze Exercise: Walking around house The following portions of the patient's history were reviewed and updated as appropriate: allergies, current medications, past medical history, past social history and problem list.  ROS as in subjective above.     Objective:    Physical Exam Alert and in no distress otherwise not examined.  Blood pressure 150/74, pulse 87, weight 128 lb (58.06 kg), SpO2 95 %.  Lab Review Diabetic Labs Latest Ref Rng 05/25/2015 11/19/2014 01/05/2014 07/07/2013 11/19/2012  HbA1c - 6.5 6.5 6.7 6.1 7.0  Chol 125 - 200 mg/dL 130(Q) - 657(Q) - 469(G)  HDL >=46 mg/dL 84 - 72 - 75  Calc LDL <130 mg/dL 295 - 284(X) - 324(M)  Triglycerides <150 mg/dL 74 - 57 - 62  Creatinine 0.60 - 0.93 mg/dL 0.10(U) - 7.25 - 3.66   BP/Weight 10/12/2015 05/25/2015 03/22/2015 11/19/2014 01/05/2014  Systolic BP 150 150 150 140 130  Diastolic BP 74 70 80 80 70  Wt. (Lbs) 128 120 118 119 123  BMI 20.67 19.38 19.05 19.22 19.86   Foot/eye exam completion dates Latest Ref Rng 07/21/2015 05/25/2015  Eye Exam No Retinopathy No Retinopathy -  Foot Form  Completion - - Done  IMA globin A1c 7.1  Beth Kelly  reports that she has quit smoking. She has never used smokeless tobacco.     Assessment & Plan:    Type 2 diabetes mellitus with other specified complication, without long-term current use of insulin (HCC) - Plan: POCT glycosylated hemoglobin (Hb A1C)  Hypertension associated with diabetes (HCC) - Plan: POCT glycosylated hemoglobin (Hb A1C)  Hyperlipidemia LDL goal <70  Personal history of noncompliance with medical treatment, presenting hazards to health    1. Rx changes: none 2. Education: Reviewed 'ABCs' of diabetes management (respective goals in parentheses):  A1C (<7), blood pressure (<130/80), and cholesterol (LDL <100). 3. Compliance at present is estimated to be poor. Efforts to improve compliance (if necessary) will be directed at tempting to get her into assisted living which she says she is ready to do. She obviously is having difficulty taking medications on mechanical scheduled basis.Marland Kitchen 4. Follow up: 4 months Overall she has been very noncompliant with her care. We have tried for long period of time to get her into assisted living to hopefully now this will occur. Attempts have been made to use a pill counting method and she has rejected this as well.

## 2016-02-07 ENCOUNTER — Ambulatory Visit: Payer: Medicare Other | Admitting: Family Medicine

## 2016-02-08 ENCOUNTER — Ambulatory Visit: Payer: Medicare Other | Admitting: Family Medicine

## 2016-02-21 ENCOUNTER — Encounter: Payer: Self-pay | Admitting: Family Medicine

## 2016-02-21 ENCOUNTER — Ambulatory Visit (INDEPENDENT_AMBULATORY_CARE_PROVIDER_SITE_OTHER): Payer: Medicare Other | Admitting: Family Medicine

## 2016-02-21 VITALS — BP 160/70 | HR 73 | Ht 66.0 in | Wt 116.0 lb

## 2016-02-21 DIAGNOSIS — I152 Hypertension secondary to endocrine disorders: Secondary | ICD-10-CM

## 2016-02-21 DIAGNOSIS — E785 Hyperlipidemia, unspecified: Secondary | ICD-10-CM

## 2016-02-21 DIAGNOSIS — I10 Essential (primary) hypertension: Secondary | ICD-10-CM

## 2016-02-21 DIAGNOSIS — E1159 Type 2 diabetes mellitus with other circulatory complications: Secondary | ICD-10-CM | POA: Diagnosis not present

## 2016-02-21 DIAGNOSIS — D649 Anemia, unspecified: Secondary | ICD-10-CM

## 2016-02-21 DIAGNOSIS — E1169 Type 2 diabetes mellitus with other specified complication: Secondary | ICD-10-CM

## 2016-02-21 LAB — POCT UA - MICROALBUMIN
Albumin/Creatinine Ratio, Urine, POC: 11.6
Creatinine, POC: 241.6 mg/dL
MICROALBUMIN (UR) POC: 28 mg/L

## 2016-02-21 LAB — POCT GLYCOSYLATED HEMOGLOBIN (HGB A1C): Hemoglobin A1C: 6.5

## 2016-02-21 NOTE — Patient Instructions (Signed)
[name] will be in charge of her medications

## 2016-02-21 NOTE — Progress Notes (Signed)
  Subjective:    Patient ID: Beth Kelly, female    DOB: 10/15/1933, 82 y.o.   MRN: [id number]  Beth Kelly is a 82 y.o. female who presents for follow-up of Type 2 diabetes mellitus.  Patient is not checking home blood sugars.   Home blood sugar records 0. She does not check her blood sugars. How often is blood sugars being checked: 0 Current symptoms/problems none Daily foot checks: yes   Any foot concerns: none Last eye exam: 10/16 groat Exercise: none She lives alone but does have the mother of her daughter-in-law come by regularly to check on her. The following portions of the patient's history were reviewed and updated as appropriate: allergies, current medications, past medical history, past social history and problem list.  ROS as in subjective above.     Objective:    Physical Exam Alert and in no distress otherwise not examined. Weight is down slightly.   Lab Review Diabetic Labs Latest Ref Rng 10/12/2015 05/25/2015 11/19/2014 01/05/2014 07/07/2013  HbA1c - 7.0 6.5 6.5 6.7 6.1  Chol 125 - 200 mg/dL - 782(N226(H) - 562(Z227(H) -  HDL >=46 mg/dL - 84 - 72 -  Calc LDL <308<130 mg/dL - 657127 - 846(N144(H) -  Triglycerides <150 mg/dL - 74 - 57 -  Creatinine 0.60 - 0.93 mg/dL - 6.29(B0.59(L) - 2.840.63 -   BP/Weight 10/12/2015 05/25/2015 03/22/2015 11/19/2014 01/05/2014  Systolic BP 150 150 150 140 130  Diastolic BP 74 70 80 80 70  Wt. (Lbs) 128 120 118 119 123  BMI 20.67 19.38 19.05 19.22 19.86   Foot/eye exam completion dates Latest Ref Rng 07/21/2015 05/25/2015  Eye Exam No Retinopathy No Retinopathy -  Foot Form Completion - - Done  Hemoglobin A1c is 6.5  Beth Kelly  reports that she has quit smoking. She has never used smokeless tobacco.     Assessment & Plan:    UNSPECIFIED ANEMIA  Hyperlipidemia LDL goal <70  Hypertension associated with diabetes (HCC)  Type 2 diabetes mellitus with other specified complication, without long-term current use of insulin (HCC) - Plan: POCT glycosylated hemoglobin (Hb  A1C), POCT UA - Microalbumin   1. Rx changes: none 2. Education: Reviewed 'ABCs' of diabetes management (respective goals in parentheses):  A1C (<7), blood pressure (<130/80), and cholesterol (LDL <100). 3. Compliance at present is estimated to be fair. Efforts to improve compliance (if necessary) will be directed at No change. 4. Follow up: 4 months Beth Kelly lives alone and is relatively noncompliant with getting her to move into assisted living of any kind is going to be very difficult. [name] will be checking on her daily to make sure she taking her medications as her blood pressure is slightly elevated but the concern is whether she is taking meds regularly. Her physical activity is quite limited and getting her to be more physically active is really not be possible. Right now she seems to be holding her own.

## 2016-06-26 ENCOUNTER — Ambulatory Visit: Payer: Medicare Other | Admitting: Family Medicine

## 2016-07-02 ENCOUNTER — Encounter: Payer: Self-pay | Admitting: Family Medicine

## 2016-07-02 ENCOUNTER — Ambulatory Visit (INDEPENDENT_AMBULATORY_CARE_PROVIDER_SITE_OTHER): Payer: Medicare Other | Admitting: Family Medicine

## 2016-07-02 VITALS — BP 144/86 | HR 79 | Ht 66.0 in | Wt 108.0 lb

## 2016-07-02 DIAGNOSIS — Z23 Encounter for immunization: Secondary | ICD-10-CM | POA: Diagnosis not present

## 2016-07-02 DIAGNOSIS — Z9119 Patient's noncompliance with other medical treatment and regimen: Secondary | ICD-10-CM

## 2016-07-02 DIAGNOSIS — E1169 Type 2 diabetes mellitus with other specified complication: Secondary | ICD-10-CM

## 2016-07-02 DIAGNOSIS — E1159 Type 2 diabetes mellitus with other circulatory complications: Secondary | ICD-10-CM

## 2016-07-02 DIAGNOSIS — Z91199 Patient's noncompliance with other medical treatment and regimen due to unspecified reason: Secondary | ICD-10-CM

## 2016-07-02 DIAGNOSIS — E785 Hyperlipidemia, unspecified: Secondary | ICD-10-CM

## 2016-07-02 DIAGNOSIS — R4189 Other symptoms and signs involving cognitive functions and awareness: Secondary | ICD-10-CM | POA: Diagnosis not present

## 2016-07-02 DIAGNOSIS — I10 Essential (primary) hypertension: Secondary | ICD-10-CM

## 2016-07-02 LAB — CBC WITH DIFFERENTIAL/PLATELET
BASOS PCT: 1 %
Basophils Absolute: 33 cells/uL (ref 0–200)
EOS ABS: 33 {cells}/uL (ref 15–500)
Eosinophils Relative: 1 %
HCT: 34.5 % — ABNORMAL LOW (ref 35.0–45.0)
Hemoglobin: 11.5 g/dL — ABNORMAL LOW (ref 11.7–15.5)
LYMPHS PCT: 39 %
Lymphs Abs: 1287 cells/uL (ref 850–3900)
MCH: 29 pg (ref 27.0–33.0)
MCHC: 33.3 g/dL (ref 32.0–36.0)
MCV: 87.1 fL (ref 80.0–100.0)
MONOS PCT: 16 %
MPV: 10.5 fL (ref 7.5–12.5)
Monocytes Absolute: 528 cells/uL (ref 200–950)
NEUTROS PCT: 43 %
Neutro Abs: 1419 cells/uL — ABNORMAL LOW (ref 1500–7800)
PLATELETS: 198 10*3/uL (ref 140–400)
RBC: 3.96 MIL/uL (ref 3.80–5.10)
RDW: 15.1 % — AB (ref 11.0–15.0)
WBC: 3.3 10*3/uL — ABNORMAL LOW (ref 4.0–10.5)

## 2016-07-02 LAB — COMPREHENSIVE METABOLIC PANEL
ALT: 7 U/L (ref 6–29)
AST: 18 U/L (ref 10–35)
Albumin: 4.1 g/dL (ref 3.6–5.1)
Alkaline Phosphatase: 61 U/L (ref 33–130)
BUN: 11 mg/dL (ref 7–25)
CHLORIDE: 103 mmol/L (ref 98–110)
CO2: 29 mmol/L (ref 20–31)
Calcium: 9.3 mg/dL (ref 8.6–10.4)
Creat: 0.64 mg/dL (ref 0.60–0.88)
GLUCOSE: 97 mg/dL (ref 65–99)
POTASSIUM: 3.6 mmol/L (ref 3.5–5.3)
Sodium: 141 mmol/L (ref 135–146)
Total Bilirubin: 0.7 mg/dL (ref 0.2–1.2)
Total Protein: 7.5 g/dL (ref 6.1–8.1)

## 2016-07-02 LAB — LIPID PANEL
CHOL/HDL RATIO: 3 ratio (ref <=5.0)
Cholesterol: 276 mg/dL — ABNORMAL HIGH (ref 125–200)
HDL: 92 mg/dL (ref >=46)
LDL CALC: 171 mg/dL — AB (ref <130)
Triglycerides: 65 mg/dL (ref <150)
VLDL: 13 mg/dL (ref <30)

## 2016-07-02 LAB — TSH: TSH: 1.3 mIU/L

## 2016-07-02 LAB — POCT GLYCOSYLATED HEMOGLOBIN (HGB A1C): Hemoglobin A1C: 6.1

## 2016-07-02 NOTE — Progress Notes (Signed)
Subjective:    Patient ID: Beth Kelly, female    DOB: 1933-10-28, 82 y.o.   MRN: [id number]  Beth Kelly is a 82 y.o. female who presents for follow-up of Type 2 diabetes mellitus.  Patient is not checking home blood sugars.   Home blood sugar records: NA How often is blood sugars being checked: NA Current symptoms/problems None Daily foot checks: yes  Any foot concerns: none Last eye exam: 2nd week of dec Dr.Groat Exercise: None. She is here with the mother of her son's wife who is essentially her caregiver here she relates the fact that the neighbors have noted Beth Kelly being out at night in her nightgown picking up twigs and sticks. She has also noted to be leaving pots on the stove unattended. She has stopped taking all of her medication stating she doesn't need any help. She admits to not eating stating she is not hungry. She is supposed to be on metformin, amlodipine, atorvastatin, lisinopril/HCTZ and Aricept. She has seen Dr. Donell Beers in the past. A MMSE was done in March 2016 with a score of 21. The following portions of the patient's history were reviewed and updated as appropriate: allergies, current medications, past medical history, past social history and problem list.  ROS as in subjective above.     Objective:    Physical Exam Alert and in no distress otherwise not examined. Her weight is recorded.  Lab Review Diabetic Labs Latest Ref Rng & Units 02/21/2016 10/12/2015 05/25/2015 11/19/2014 01/05/2014  HbA1c - 6.5 7.0 6.5 6.5 6.7  Microalbumin mg/Beth 28.0 - - - 6.2  Micro/Creat Ratio - 11.6 - - - 10.0  Chol 125 - 200 mg/dL - - 409(W) - 119(J)  HDL >=46 mg/dL - - 84 - 72  Calc LDL <478 mg/dL - - 295 - 621(H)  Triglycerides <150 mg/dL - - 74 - 57  Creatinine 0.60 - 0.93 mg/dL - - 0.86(V) - 7.84   BP/Weight 02/21/2016 10/12/2015 05/25/2015 03/22/2015 11/19/2014  Systolic BP 160 150 150 150 140  Diastolic BP 70 74 70 80 80  Wt. (Lbs) 116 128 120 118 119  BMI 18.73 20.67 19.38 19.05  19.22   Foot/eye exam completion dates Latest Ref Rng & Units 07/21/2015 05/25/2015  Eye Exam No Retinopathy No Retinopathy -  Foot Form Completion - - Done  Hemoglobin A1c is 6.1 MMSE 12 [place]  reports that she has quit smoking. She has never used smokeless tobacco.     Assessment & Plan:    Hyperlipidemia LDL goal <70 - Plan: Lipid panel  Hypertension associated with diabetes (HCC) - Plan: CBC with Differential/Platelet, Comprehensive metabolic panel  Type 2 diabetes mellitus with other specified complication, without long-term current use of insulin (HCC) - Plan: HgB A1c  Personal history of noncompliance with medical treatment, presenting hazards to health  Cognitive impairment - Plan: CBC with Differential/Platelet, Comprehensive metabolic panel, Lipid panel, TSH  Need for prophylactic vaccination and inoculation against influenza - Plan: Flu vaccine HIGH DOSE PF (Fluzone High dose)   1. Rx changes: none 2. Education: Reviewed 'ABCs' of diabetes management (respective goals in parentheses):  A1C (<7), blood pressure (<130/80), and cholesterol (LDL <100). 3. Compliance at present is estimated to be unsatisfactory. Efforts to improve compliance (if necessary) will be directed at Getting Meals on Wheels involved, I will also get home health.. 4. Follow up: 4 month Her caregiver will also get her an appointment to see psychiatry and see if we can get Meals  on Wheels. Cognitively she has lost ground on this. He is now getting to the point where she is dangerous to her own health and it might be time to look at getting her declared incompetent.

## 2016-08-30 DIAGNOSIS — H25813 Combined forms of age-related cataract, bilateral: Secondary | ICD-10-CM | POA: Diagnosis not present

## 2016-08-30 DIAGNOSIS — E119 Type 2 diabetes mellitus without complications: Secondary | ICD-10-CM | POA: Diagnosis not present

## 2016-08-30 LAB — HM DIABETES EYE EXAM

## 2016-10-29 ENCOUNTER — Ambulatory Visit: Payer: Medicare Other | Admitting: Family Medicine

## 2016-11-19 ENCOUNTER — Encounter: Payer: Self-pay | Admitting: Family Medicine

## 2016-11-19 ENCOUNTER — Ambulatory Visit (INDEPENDENT_AMBULATORY_CARE_PROVIDER_SITE_OTHER): Payer: Medicare Other | Admitting: Family Medicine

## 2016-11-19 VITALS — BP 148/70 | HR 76 | Wt 110.0 lb

## 2016-11-19 DIAGNOSIS — Z9119 Patient's noncompliance with other medical treatment and regimen: Secondary | ICD-10-CM | POA: Diagnosis not present

## 2016-11-19 DIAGNOSIS — E785 Hyperlipidemia, unspecified: Secondary | ICD-10-CM

## 2016-11-19 DIAGNOSIS — E1159 Type 2 diabetes mellitus with other circulatory complications: Secondary | ICD-10-CM | POA: Diagnosis not present

## 2016-11-19 DIAGNOSIS — R4189 Other symptoms and signs involving cognitive functions and awareness: Secondary | ICD-10-CM | POA: Diagnosis not present

## 2016-11-19 DIAGNOSIS — I10 Essential (primary) hypertension: Secondary | ICD-10-CM

## 2016-11-19 DIAGNOSIS — E1169 Type 2 diabetes mellitus with other specified complication: Secondary | ICD-10-CM | POA: Diagnosis not present

## 2016-11-19 DIAGNOSIS — Z91199 Patient's noncompliance with other medical treatment and regimen due to unspecified reason: Secondary | ICD-10-CM

## 2016-11-19 LAB — POCT GLYCOSYLATED HEMOGLOBIN (HGB A1C): Hemoglobin A1C: 6.5

## 2016-11-19 MED ORDER — LISINOPRIL-HYDROCHLOROTHIAZIDE 10-12.5 MG PO TABS: 1.0000 | ORAL_TABLET | Freq: Every day | ORAL | 3 refills | Status: DC

## 2016-11-19 MED ORDER — ATORVASTATIN CALCIUM 20 MG PO TABS: 20.0000 mg | ORAL_TABLET | Freq: Every day | ORAL | 3 refills | Status: DC

## 2016-11-19 NOTE — Progress Notes (Signed)
   Subjective:    Patient ID: Beth Kelly, female    DOB: 08/10/1934, 83 y.o.   MRN: [id number]  HPI She is here for a follow-up visit. Presently she is on no medications. Her neighbor who is essentially her caregiver is helping and visiting daily. Apparently the house has now wired with  cameras and the sons are keeping track of her. We have tried in the past to get home health agencies and but she keeps refusing help.   Review of Systems     Objective:   Physical Exam Alert and in no distress. She knows her name and where she is right now but does not know the day month or year and does not know who the president is.  A1c is 6.5.     Assessment & Plan:  Cognitive impairment  Hyperlipidemia LDL goal <70  Hypertension associated with diabetes (HCC)  Type 2 diabetes mellitus with other specified complication, without long-term current use of insulin (HCC)  Personal history of noncompliance with medical treatment, presenting hazards to health Formation given concerning  PACE as a way to get her taken care of properly. Also discussed with her caregiver who who is the mother of her son's wife getting by mouth a taking care of including medical as well as legal POA. Hopefully they will agree with getting her involved in this program.

## 2016-11-20 ENCOUNTER — Ambulatory Visit: Payer: Medicare Other | Admitting: Family Medicine

## 2017-02-05 ENCOUNTER — Telehealth: Payer: Self-pay | Admitting: Family Medicine

## 2017-02-05 NOTE — Telephone Encounter (Signed)
Left message for care provider to call. She needs to schedule her medicare well visit

## 2017-05-14 DIAGNOSIS — L03115 Cellulitis of right lower limb: Secondary | ICD-10-CM | POA: Diagnosis not present

## 2017-05-14 DIAGNOSIS — L97521 Non-pressure chronic ulcer of other part of left foot limited to breakdown of skin: Secondary | ICD-10-CM | POA: Diagnosis not present

## 2017-05-14 DIAGNOSIS — L03116 Cellulitis of left lower limb: Secondary | ICD-10-CM | POA: Diagnosis not present

## 2017-05-14 DIAGNOSIS — L97311 Non-pressure chronic ulcer of right ankle limited to breakdown of skin: Secondary | ICD-10-CM | POA: Diagnosis not present

## 2017-05-15 ENCOUNTER — Encounter: Payer: Self-pay | Admitting: Family Medicine

## 2017-05-15 ENCOUNTER — Ambulatory Visit (INDEPENDENT_AMBULATORY_CARE_PROVIDER_SITE_OTHER): Payer: Medicare Other | Admitting: Family Medicine

## 2017-05-15 VITALS — BP 150/100 | HR 93 | Ht 66.0 in | Wt 105.0 lb

## 2017-05-15 DIAGNOSIS — Z9119 Patient's noncompliance with other medical treatment and regimen: Secondary | ICD-10-CM

## 2017-05-15 DIAGNOSIS — R32 Unspecified urinary incontinence: Secondary | ICD-10-CM

## 2017-05-15 DIAGNOSIS — Z23 Encounter for immunization: Secondary | ICD-10-CM

## 2017-05-15 DIAGNOSIS — E1159 Type 2 diabetes mellitus with other circulatory complications: Secondary | ICD-10-CM

## 2017-05-15 DIAGNOSIS — R4189 Other symptoms and signs involving cognitive functions and awareness: Secondary | ICD-10-CM

## 2017-05-15 DIAGNOSIS — E1169 Type 2 diabetes mellitus with other specified complication: Secondary | ICD-10-CM | POA: Diagnosis not present

## 2017-05-15 DIAGNOSIS — E785 Hyperlipidemia, unspecified: Secondary | ICD-10-CM | POA: Diagnosis not present

## 2017-05-15 DIAGNOSIS — R634 Abnormal weight loss: Secondary | ICD-10-CM | POA: Diagnosis not present

## 2017-05-15 DIAGNOSIS — I10 Essential (primary) hypertension: Secondary | ICD-10-CM | POA: Diagnosis not present

## 2017-05-15 DIAGNOSIS — R627 Adult failure to thrive: Secondary | ICD-10-CM

## 2017-05-15 DIAGNOSIS — Z91199 Patient's noncompliance with other medical treatment and regimen due to unspecified reason: Secondary | ICD-10-CM

## 2017-05-15 LAB — POCT URINALYSIS DIPSTICK
BILIRUBIN UA: NEGATIVE
GLUCOSE UA: NEGATIVE
KETONES UA: NEGATIVE
Leukocytes, UA: NEGATIVE
Nitrite, UA: NEGATIVE
SPEC GRAV UA: 1.025 (ref 1.010–1.025)
UROBILINOGEN UA: 0.2 U/dL
pH, UA: 6 (ref 5.0–8.0)

## 2017-05-15 LAB — POCT GLYCOSYLATED HEMOGLOBIN (HGB A1C): Hemoglobin A1C: 6.8

## 2017-05-15 MED ORDER — LISINOPRIL-HYDROCHLOROTHIAZIDE 10-12.5 MG PO TABS: 1.0000 | ORAL_TABLET | Freq: Every day | ORAL | 3 refills | Status: DC

## 2017-05-15 MED ORDER — ATORVASTATIN CALCIUM 20 MG PO TABS: 20.0000 mg | ORAL_TABLET | Freq: Every day | ORAL | 3 refills | Status: DC
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
## 2017-05-21 DIAGNOSIS — L03116 Cellulitis of left lower limb: Secondary | ICD-10-CM | POA: Diagnosis not present

## 2017-05-21 DIAGNOSIS — L03115 Cellulitis of right lower limb: Secondary | ICD-10-CM | POA: Diagnosis not present

## 2017-05-28 DIAGNOSIS — L97522 Non-pressure chronic ulcer of other part of left foot with fat layer exposed: Secondary | ICD-10-CM | POA: Diagnosis not present

## 2017-08-20 DIAGNOSIS — L603 Nail dystrophy: Secondary | ICD-10-CM | POA: Diagnosis not present

## 2017-08-20 DIAGNOSIS — I739 Peripheral vascular disease, unspecified: Secondary | ICD-10-CM | POA: Diagnosis not present

## 2017-09-04 DIAGNOSIS — E119 Type 2 diabetes mellitus without complications: Secondary | ICD-10-CM | POA: Diagnosis not present

## 2017-09-04 DIAGNOSIS — H25813 Combined forms of age-related cataract, bilateral: Secondary | ICD-10-CM | POA: Diagnosis not present

## 2018-03-13 ENCOUNTER — Ambulatory Visit (INDEPENDENT_AMBULATORY_CARE_PROVIDER_SITE_OTHER): Payer: Medicare Other | Admitting: Family Medicine

## 2018-03-13 ENCOUNTER — Encounter: Payer: Self-pay | Admitting: Family Medicine

## 2018-03-13 VITALS — BP 130/74 | HR 74 | Temp 98.3°F | Wt 110.8 lb

## 2018-03-13 DIAGNOSIS — E1159 Type 2 diabetes mellitus with other circulatory complications: Secondary | ICD-10-CM | POA: Diagnosis not present

## 2018-03-13 DIAGNOSIS — R609 Edema, unspecified: Secondary | ICD-10-CM | POA: Diagnosis not present

## 2018-03-13 DIAGNOSIS — E785 Hyperlipidemia, unspecified: Secondary | ICD-10-CM | POA: Diagnosis not present

## 2018-03-13 DIAGNOSIS — I10 Essential (primary) hypertension: Secondary | ICD-10-CM | POA: Diagnosis not present

## 2018-03-13 DIAGNOSIS — E1169 Type 2 diabetes mellitus with other specified complication: Secondary | ICD-10-CM

## 2018-03-13 DIAGNOSIS — R4189 Other symptoms and signs involving cognitive functions and awareness: Secondary | ICD-10-CM

## 2018-03-13 DIAGNOSIS — E119 Type 2 diabetes mellitus without complications: Secondary | ICD-10-CM

## 2018-03-13 DIAGNOSIS — I152 Hypertension secondary to endocrine disorders: Secondary | ICD-10-CM

## 2018-03-13 MED ORDER — METFORMIN HCL 850 MG PO TABS: ORAL_TABLET | ORAL | 1 refills | Status: AC

## 2018-03-13 MED ORDER — ATORVASTATIN CALCIUM 20 MG PO TABS: 20.0000 mg | ORAL_TABLET | Freq: Every day | ORAL | 3 refills | Status: DC

## 2018-03-13 MED ORDER — LISINOPRIL-HYDROCHLOROTHIAZIDE 10-12.5 MG PO TABS: 1.0000 | ORAL_TABLET | Freq: Every day | ORAL | 3 refills | Status: AC

## 2018-03-13 MED ORDER — AMLODIPINE BESYLATE 10 MG PO TABS: 10.0000 mg | ORAL_TABLET | Freq: Every day | ORAL | 3 refills | Status: AC

## 2018-03-13 NOTE — Patient Instructions (Signed)
keep your legs elevated as much as possible okay

## 2018-03-13 NOTE — Progress Notes (Signed)
   Subjective:    Patient ID: Delfino LovettElnora E Okeefe, female    DOB: 08-Aug-1934, 84 y.o.   MRN: [id number]  HPI She is here for an interval evaluation.  She [facility]  This is been going on since December.  He come by for 8 hours/day and split shifts.  And make sure that she gets fed and apparently she is able to take care of her ADLs without difficulty.  1 of her caregivers is here with her today.  Her main concern is bilateral leg swelling it tends to get worse as the day goes on.  She has had no chest pain, shortness of breath, PND.  She continues on medications listed in the chart and is having no difficulty with that.  Smoking and drinking are not an issue.   Review of Systems     Objective:   Physical Exam Alert and in no distress. Tympanic membranes and canals are normal. Pharyngeal area is normal. Neck is supple without adenopathy or thyromegaly. Cardiac exam shows a regular sinus rhythm without murmurs or gallops. Lungs are clear to auscultation. Lower extremity exam does show 1+ pitting edema.  A1c is 6.2     Assessment & Plan:  Type 2 diabetes mellitus with other specified complication, without long-term current use of insulin (HCC) - Plan: CBC with Differential/Platelet, Comprehensive metabolic panel, Lipid panel, POCT UA - Microalbumin  Cognitive impairment  Hyperlipidemia LDL goal <70 - Plan: Lipid panel, atorvastatin (LIPITOR) 20 MG tablet  Hypertension associated with diabetes (HCC) - Plan: CBC with Differential/Platelet, Comprehensive metabolic panel, amLODipine (NORVASC) 10 MG tablet, lisinopril-hydrochlorothiazide (PRINZIDE,ZESTORETIC) 10-12.5 MG tablet  Dependent edema  Type 2 diabetes mellitus without complication, without long-term current use of insulin (HCC) - Plan: metFORMIN (GLUCOPHAGE) 850 MG tablet Recommended keeping the feet elevated as much as possible.  Explained that this is really gravity as opposed to any other  problems.  Continue on present medications.  Since she is doing so well and has actually gained weight, recheck here in 6 months.

## 2018-03-14 LAB — CBC WITH DIFFERENTIAL/PLATELET
Basophils Absolute: 0 10*3/uL (ref 0.0–0.2)
Basos: 1 %
EOS (ABSOLUTE): 0.1 10*3/uL (ref 0.0–0.4)
Eos: 2 %
Hematocrit: 32 % — ABNORMAL LOW (ref 34.0–46.6)
Hemoglobin: 10.8 g/dL — ABNORMAL LOW (ref 11.1–15.9)
IMMATURE GRANULOCYTES: 0 %
Immature Grans (Abs): 0 10*3/uL (ref 0.0–0.1)
LYMPHS ABS: 1.5 10*3/uL (ref 0.7–3.1)
Lymphs: 40 %
MCH: 29.4 pg (ref 26.6–33.0)
MCHC: 33.8 g/dL (ref 31.5–35.7)
MCV: 87 fL (ref 79–97)
MONOS ABS: 0.5 10*3/uL (ref 0.1–0.9)
Monocytes: 14 %
NEUTROS PCT: 43 %
Neutrophils Absolute: 1.7 10*3/uL (ref 1.4–7.0)
PLATELETS: 161 10*3/uL (ref 150–450)
RBC: 3.67 x10E6/uL — AB (ref 3.77–5.28)
RDW: 12.9 % (ref 12.3–15.4)
WBC: 3.9 10*3/uL (ref 3.4–10.8)

## 2018-03-14 LAB — COMPREHENSIVE METABOLIC PANEL
A/G RATIO: 1.5 (ref 1.2–2.2)
ALK PHOS: 85 IU/L (ref 39–117)
ALT: 7 IU/L (ref 0–32)
AST: 19 IU/L (ref 0–40)
Albumin: 4 g/dL (ref 3.5–4.7)
BUN/Creatinine Ratio: 17 (ref 12–28)
BUN: 12 mg/dL (ref 8–27)
Bilirubin Total: 0.3 mg/dL (ref 0.0–1.2)
CALCIUM: 8.8 mg/dL (ref 8.7–10.3)
CO2: 28 mmol/L (ref 20–29)
CREATININE: 0.69 mg/dL (ref 0.57–1.00)
Chloride: 102 mmol/L (ref 96–106)
GFR calc Af Amer: 94 mL/min/{1.73_m2} (ref >59)
GFR, EST NON AFRICAN AMERICAN: 82 mL/min/{1.73_m2} (ref >59)
GLOBULIN, TOTAL: 2.7 g/dL (ref 1.5–4.5)
Glucose: 80 mg/dL (ref 65–99)
POTASSIUM: 4 mmol/L (ref 3.5–5.2)
SODIUM: 141 mmol/L (ref 134–144)
Total Protein: 6.7 g/dL (ref 6.0–8.5)

## 2018-03-14 LAB — POCT GLYCOSYLATED HEMOGLOBIN (HGB A1C): Hemoglobin A1C: 6.2 % — AB (ref 4.0–5.6)

## 2018-03-14 LAB — LIPID PANEL
CHOL/HDL RATIO: 2.4 ratio (ref 0.0–4.4)
CHOLESTEROL TOTAL: 224 mg/dL — AB (ref 100–199)
HDL: 92 mg/dL (ref >39)
LDL Calculated: 122 mg/dL — ABNORMAL HIGH (ref 0–99)
TRIGLYCERIDES: 51 mg/dL (ref 0–149)
VLDL Cholesterol Cal: 10 mg/dL (ref 5–40)

## 2018-04-21 ENCOUNTER — Ambulatory Visit: Payer: Medicare Other | Admitting: Family Medicine

## 2018-04-23 ENCOUNTER — Emergency Department (HOSPITAL_COMMUNITY)
Admission: EM | Admit: 2018-04-23 | Discharge: 2018-04-23 | Disposition: A | Discharge status: Home / Self Care | Payer: Medicare Other | Attending: Emergency Medicine | Admitting: Emergency Medicine

## 2018-04-23 ENCOUNTER — Encounter (HOSPITAL_COMMUNITY): Payer: Self-pay

## 2018-04-23 ENCOUNTER — Emergency Department (HOSPITAL_COMMUNITY): Payer: Medicare Other

## 2018-04-23 ENCOUNTER — Other Ambulatory Visit: Payer: Self-pay

## 2018-04-23 DIAGNOSIS — S79911A Unspecified injury of right hip, initial encounter: Secondary | ICD-10-CM | POA: Diagnosis not present

## 2018-04-23 DIAGNOSIS — Y9301 Activity, walking, marching and hiking: Secondary | ICD-10-CM | POA: Insufficient documentation

## 2018-04-23 DIAGNOSIS — Y9289 Other specified places as the place of occurrence of the external cause: Secondary | ICD-10-CM | POA: Insufficient documentation

## 2018-04-23 DIAGNOSIS — I10 Essential (primary) hypertension: Secondary | ICD-10-CM | POA: Insufficient documentation

## 2018-04-23 DIAGNOSIS — Z87891 Personal history of nicotine dependence: Secondary | ICD-10-CM | POA: Diagnosis not present

## 2018-04-23 DIAGNOSIS — Z7984 Long term (current) use of oral hypoglycemic drugs: Secondary | ICD-10-CM | POA: Diagnosis not present

## 2018-04-23 DIAGNOSIS — W010XXA Fall on same level from slipping, tripping and stumbling without subsequent striking against object, initial encounter: Secondary | ICD-10-CM | POA: Diagnosis not present

## 2018-04-23 DIAGNOSIS — M25551 Pain in right hip: Secondary | ICD-10-CM | POA: Diagnosis not present

## 2018-04-23 DIAGNOSIS — E119 Type 2 diabetes mellitus without complications: Secondary | ICD-10-CM | POA: Diagnosis not present

## 2018-04-23 DIAGNOSIS — Z79899 Other long term (current) drug therapy: Secondary | ICD-10-CM | POA: Diagnosis not present

## 2018-04-23 DIAGNOSIS — R52 Pain, unspecified: Secondary | ICD-10-CM | POA: Diagnosis not present

## 2018-04-23 DIAGNOSIS — W19XXXA Unspecified fall, initial encounter: Secondary | ICD-10-CM

## 2018-04-23 DIAGNOSIS — S79912A Unspecified injury of left hip, initial encounter: Secondary | ICD-10-CM | POA: Diagnosis not present

## 2018-04-23 DIAGNOSIS — Y999 Unspecified external cause status: Secondary | ICD-10-CM | POA: Diagnosis not present

## 2018-04-23 DIAGNOSIS — F0390 Unspecified dementia without behavioral disturbance: Secondary | ICD-10-CM | POA: Insufficient documentation

## 2018-04-23 LAB — CBG MONITORING, ED: GLUCOSE-CAPILLARY: 74 mg/dL (ref 70–99)

## 2018-04-23 MED ORDER — ACETAMINOPHEN 325 MG PO TABS: 650.0000 mg | ORAL_TABLET | Freq: Once | ORAL | Status: DC

## 2018-04-23 NOTE — ED Notes (Signed)
Family called to inquire about patient status as patient was dressed and sitting in the lobby. Called back and reported that patient was an elopement. Family understands.

## 2018-04-23 NOTE — Discharge Instructions (Addendum)
Your x-rays look good. There are no fractures or dislocations in your hip or pelvis. Please follow-up with your PCP as you are scheduled to.  Thank you for allowing me to take care of you today!

## 2018-04-23 NOTE — ED Provider Notes (Signed)
[facility]-EMERGENCY DEPT Provider Note  CSN: [id number] Arrival date & time: 04/23/18  1305  History   Chief Complaint Chief Complaint  Patient presents with  . Fall  . Hip Pain    HPI Beth Kelly is a 84 y.o. Female with a history of cognitive impairment, Type 2 DM, HLD and HTN who presented to the ED following a fall. Patient does not recall falling and has no physical complaints or endorse any pain. Collateral information obtained from patient's family who states that patient fell on her right side while walking in the carport. Denies head trauma, LOC, change in patient's baseline mental function or behaviors. Following fall, it was reported that patient complained of hip pain. Patient is not on an anticoagulation.      Past Medical History:  Diagnosis Date  . Arthritis   . Diabetes mellitus   . Dyslipidemia   . Hypertension     Patient Active Problem List   Diagnosis Date Noted  . Hyperlipidemia LDL goal <70 07/31/2011  . Hypertension associated with diabetes (HCC) 07/31/2011  . Diabetes mellitus (HCC) 07/31/2011  . UNSPECIFIED ANEMIA 09/29/2008    History reviewed. No pertinent surgical history.   OB History   None      Home Medications    Prior to Admission medications   Medication Sig Start Date End Date Taking? Authorizing Provider  acetaminophen (TYLENOL) 500 MG tablet Take 500 mg by mouth every 6 (six) hours as needed for moderate pain.   Yes [provider]  amLODipine (NORVASC) 10 MG tablet Take 1 tablet (10 mg total) by mouth daily. 03/13/18   Ronnald NianLalonde, John C, MD  atorvastatin (LIPITOR) 20 MG tablet Take 1 tablet (20 mg total) by mouth daily. 03/13/18   Ronnald NianLalonde, John C, MD  donepezil (ARICEPT) 5 MG tablet Take 1 tablet (5 mg total) by mouth at bedtime as needed. Dr.plovsky Patient not taking: Reported on 07/02/2016 01/05/14   Ronnald NianLalonde, John C, MD  lisinopril-hydrochlorothiazide (PRINZIDE,ZESTORETIC) 10-12.5 MG tablet Take 1  tablet by mouth daily. 03/13/18   Ronnald NianLalonde, John C, MD  metFORMIN (GLUCOPHAGE) 850 MG tablet TAKE 1 TABLET BY MOUTH TWICE A DAY WITH A MEAL 03/13/18   Ronnald NianLalonde, John C, MD    Family History History reviewed. No pertinent family history.  Social History Social History   Tobacco Use  . Smoking status: Former Games developermoker  . Smokeless tobacco: Never Used  Substance Use Topics  . Alcohol use: Not on file  . Drug use: Not on file     Allergies   Asa buff (mag [buffered aspirin]   Review of Systems Review of Systems  Unable to perform ROS: Dementia     Physical Exam Updated Vital Signs BP (!) 168/72   Pulse 72   Temp 98.1 F (36.7 C) (Oral)   Resp 18   Ht 5\' 6"  (1.676 m)   Wt 54.4 kg (120 lb)   SpO2 98%   BMI 19.37 kg/m   Physical Exam  Constitutional: She is cooperative.  Eyes: Pupils are equal, round, and reactive to light. Conjunctivae, EOM and lids are normal.  Cardiovascular: Normal rate and regular rhythm.  No murmur heard. Pulmonary/Chest: Effort normal and breath sounds normal.  Musculoskeletal: Normal range of motion.  Full ROM in upper and lower extremities bilaterally with 5/5 strength. No bony tenderness of pelvis or lower extremities. No muscular tenderness.   Neurological: She is alert. She has normal strength. No cranial nerve deficit or sensory deficit.  She exhibits normal muscle tone.  Skin: Skin is warm and intact. No abrasion, no bruising, no ecchymosis and no laceration noted.  Psychiatric: She has a normal mood and affect. Her speech is normal and behavior is normal. Thought content normal. Cognition and memory are impaired. She exhibits abnormal recent memory and abnormal remote memory.  Nursing note and vitals reviewed.    ED Treatments / Results  Labs (all labs ordered are listed, but only abnormal results are displayed) Labs Reviewed  CBG MONITORING, ED    EKG None  Radiology Dg Hips Bilat W Or Wo Pelvis 5 Views  Result Date:  04/23/2018 CLINICAL DATA:  Larey Seat onto the right side at home in the car port. Persistent right hip pain without obvious deformities. EXAM: DG HIP (WITH OR WITHOUT PELVIS) 5+V BILAT COMPARISON:  None in PACs FINDINGS: The bony pelvis is subjectively osteopenic. No acute pelvic fracture is observed. The hip joint spaces are well maintained. AP and lateral views of both hips reveal no acute fractures. The articular surfaces of the femoral heads and acetabuli remains smoothly rounded. The femoral necks, intertrochanteric, and subtrochanteric regions are normal. IMPRESSION: There is no acute fracture of either hip nor of the bony pelvis. Electronically Signed   By: David  [place] M.D.   On: 04/23/2018 15:06    Procedures Procedures (including critical care time)  Medications Ordered in ED Medications  acetaminophen (TYLENOL) tablet 650 mg (has no administration in time range)     Initial Impression / Assessment and Plan / ED Course  Triage vital signs and the nursing notes have been reviewed.  Pertinent labs & imaging results that were available during care of the patient were reviewed and considered in medical decision making (see chart for details).  Patient presents after a mechanical fall which was witnessed by family and neighbor. There was no head trauma, LOC or change in pt's baseline mental status or behaviors. Initial complaint of hip pain so hip imaging was obtained to evaluate for possible fracture.  Clinical Course as of Apr 23 1916  Wed Apr 23, 2018  1451 POC blood glucose ordered as family expressed concern over blood sugar and states patient has been urinating a lot since coming to the ED.   [GM]  1513 Hip and pelvis x-rays do not show any fractures or dislocations.   [GM]  1542 Spoke with family and patient regarding imaging results. At bedside, there were 4 family members present. X-rays showed no fractures or dislocations. Discharge plans were discussed.   [GM]  1628 Nurse  alerted this provider while in another patient room that this patient has left the ED because her family was ready to take her. Nurse states that she walked the patient out to the front of the ED where she was picked up by a family member. Patient was ambulating on her own without issue or pain.  Discharge orders were not placed by this provider yet.   [GM]  1708 Later reports from a different nurse state that patient's daughter called inquiring about patient's whereabouts and stated that family was not with patient.   [GM]  1858 Charge nurse called patient's daughter to inform her that other family members were present during the pt's ED visit and were the ones who transported her home.   [GM]    Clinical Course User Index [GM] Nikhita Mentzel, Sharyon Medicus, PA-C    Final Clinical Impressions(s) / ED Diagnoses  1. Fall. No hip/pelvis fractures/dislocations. Patient eloped prior to discharge orders  being placed. Fortunately, patient was physically stable for discharge.   Dispo: Eloped. Discharge papers were printed if patient or family returns.   Final diagnoses:  Fall, initial encounter    ED Discharge Orders    None        Reva Bores 04/23/18 1610    Tegeler, Canary Brim, MD 04/23/18 [phone]

## 2018-04-23 NOTE — ED Triage Notes (Signed)
EMS reports from home, fall on right side in carport at home, pain to hip area. No obvious deformities, no LOC, denies other pain. Denies blood thinners. Hx of dementia.  BP 178/80 HR 70 Resp 20 Sp02

## 2018-05-07 ENCOUNTER — Encounter (HOSPITAL_COMMUNITY): Payer: Self-pay | Admitting: Emergency Medicine

## 2018-05-07 ENCOUNTER — Emergency Department (HOSPITAL_COMMUNITY): Payer: Medicare Other

## 2018-05-07 ENCOUNTER — Emergency Department (HOSPITAL_COMMUNITY)
Admission: EM | Admit: 2018-05-07 | Discharge: 2018-05-08 | Disposition: A | Discharge status: Home / Self Care | Payer: Medicare Other | Attending: Emergency Medicine | Admitting: Emergency Medicine

## 2018-05-07 DIAGNOSIS — E119 Type 2 diabetes mellitus without complications: Secondary | ICD-10-CM | POA: Diagnosis not present

## 2018-05-07 DIAGNOSIS — R4182 Altered mental status, unspecified: Secondary | ICD-10-CM | POA: Insufficient documentation

## 2018-05-07 DIAGNOSIS — R918 Other nonspecific abnormal finding of lung field: Secondary | ICD-10-CM | POA: Diagnosis not present

## 2018-05-07 DIAGNOSIS — Z79899 Other long term (current) drug therapy: Secondary | ICD-10-CM | POA: Insufficient documentation

## 2018-05-07 DIAGNOSIS — R600 Localized edema: Secondary | ICD-10-CM | POA: Insufficient documentation

## 2018-05-07 DIAGNOSIS — R52 Pain, unspecified: Secondary | ICD-10-CM

## 2018-05-07 DIAGNOSIS — R4020 Unspecified coma: Secondary | ICD-10-CM | POA: Diagnosis not present

## 2018-05-07 DIAGNOSIS — Z87891 Personal history of nicotine dependence: Secondary | ICD-10-CM | POA: Insufficient documentation

## 2018-05-07 DIAGNOSIS — I10 Essential (primary) hypertension: Secondary | ICD-10-CM | POA: Diagnosis not present

## 2018-05-07 DIAGNOSIS — R609 Edema, unspecified: Secondary | ICD-10-CM | POA: Diagnosis not present

## 2018-05-07 DIAGNOSIS — R531 Weakness: Secondary | ICD-10-CM

## 2018-05-07 DIAGNOSIS — Z7984 Long term (current) use of oral hypoglycemic drugs: Secondary | ICD-10-CM | POA: Insufficient documentation

## 2018-05-07 DIAGNOSIS — R410 Disorientation, unspecified: Secondary | ICD-10-CM | POA: Diagnosis not present

## 2018-05-07 LAB — URINALYSIS, ROUTINE W REFLEX MICROSCOPIC
Bilirubin Urine: NEGATIVE
GLUCOSE, UA: NEGATIVE mg/dL
Ketones, ur: NEGATIVE mg/dL
Nitrite: NEGATIVE
PH: 8 (ref 5.0–8.0)
Protein, ur: NEGATIVE mg/dL
SPECIFIC GRAVITY, URINE: 1.005 (ref 1.005–1.030)

## 2018-05-07 LAB — HEPATIC FUNCTION PANEL
ALT: 11 U/L (ref 0–44)
AST: 21 U/L (ref 15–41)
Albumin: 3.8 g/dL (ref 3.5–5.0)
Alkaline Phosphatase: 74 U/L (ref 38–126)
BILIRUBIN DIRECT: 0.2 mg/dL (ref 0.0–0.2)
Indirect Bilirubin: 0.7 mg/dL (ref 0.3–0.9)
Total Bilirubin: 0.9 mg/dL (ref 0.3–1.2)
Total Protein: 6.9 g/dL (ref 6.5–8.1)

## 2018-05-07 LAB — CBC WITH DIFFERENTIAL/PLATELET
Abs Immature Granulocytes: 0 10*3/uL (ref 0.0–0.1)
BASOS PCT: 1 %
Basophils Absolute: 0 10*3/uL (ref 0.0–0.1)
EOS ABS: 0.1 10*3/uL (ref 0.0–0.7)
EOS PCT: 2 %
HCT: 37.2 % (ref 36.0–46.0)
Hemoglobin: 11.8 g/dL — ABNORMAL LOW (ref 12.0–15.0)
Immature Granulocytes: 0 %
LYMPHS ABS: 1.5 10*3/uL (ref 0.7–4.0)
Lymphocytes Relative: 40 %
MCH: 29.3 pg (ref 26.0–34.0)
MCHC: 31.7 g/dL (ref 30.0–36.0)
MCV: 92.3 fL (ref 78.0–100.0)
MONOS PCT: 14 %
Monocytes Absolute: 0.5 10*3/uL (ref 0.1–1.0)
Neutro Abs: 1.6 10*3/uL — ABNORMAL LOW (ref 1.7–7.7)
Neutrophils Relative %: 43 %
Platelets: 186 10*3/uL (ref 150–400)
RBC: 4.03 MIL/uL (ref 3.87–5.11)
RDW: 14.7 % (ref 11.5–15.5)
WBC: 3.7 10*3/uL — ABNORMAL LOW (ref 4.0–10.5)

## 2018-05-07 LAB — BASIC METABOLIC PANEL
Anion gap: 10 (ref 5–15)
BUN: 12 mg/dL (ref 8–23)
CALCIUM: 9.1 mg/dL (ref 8.9–10.3)
CO2: 29 mmol/L (ref 22–32)
CREATININE: 0.88 mg/dL (ref 0.44–1.00)
Chloride: 103 mmol/L (ref 98–111)
GFR calc Af Amer: >60 mL/min (ref >60)
GFR calc non Af Amer: 60 mL/min — ABNORMAL LOW (ref >60)
Glucose, Bld: 106 mg/dL — ABNORMAL HIGH (ref 70–99)
Potassium: 3.8 mmol/L (ref 3.5–5.1)
Sodium: 142 mmol/L (ref 135–145)

## 2018-05-07 LAB — CBG MONITORING, ED: Glucose-Capillary: 114 mg/dL — ABNORMAL HIGH (ref 70–99)

## 2018-05-07 LAB — BRAIN NATRIURETIC PEPTIDE: B Natriuretic Peptide: 36.6 pg/mL (ref 0.0–100.0)

## 2018-05-07 LAB — TROPONIN I

## 2018-05-07 NOTE — ED Notes (Signed)
Patient transported to CT 

## 2018-05-07 NOTE — ED Triage Notes (Addendum)
BIB by [facility] from home. Neighbors called after pt was difficult to arouse and more lethargic than usual. Pt alert on arrival with family present. Hx of dementia and diabetes. Not taking any medications currently. Bilateral, pitting pedal edema noted to both legs.

## 2018-05-07 NOTE — ED Notes (Signed)
ED CBG: 114

## 2018-05-07 NOTE — Discharge Instructions (Addendum)
Delfino LovettElnora E Rice:  Thank you for allowing us to take care of you today.  We hope you begin feeling better soon.  To-Do: Please follow-up with your primary doctor Please return to the Emergency Department or call 911 if you experience chest pain, shortness of breath, severe pain, severe fever, altered mental status, or have any reason to think that you need emergency medical care.  Thank you again.  Hope you feel better soon.

## 2018-05-07 NOTE — ED Provider Notes (Signed)
[facility] Provider [facility]   CSN: [id number] Arrival date & time: 05/07/18  2130     History   Chief Complaint Chief Complaint  Patient presents with  . Altered Mental Status    HPI Beth Kelly is a 84 y.o. female with diabetes, hypertension, and hyperlipidemia who presents due to an episode of unresponsiveness earlier this evening.  Her family and caregiver say that she has been acting normally all day.  Then, at about 8 PM, she had difficulty walking, difficulty talking, and then laid down but was difficult to arouse.  She was unable to be awakened for about 15 minutes.  The family says that when she awoke, that she recognized everyone but she was unable to use either of her upper extremities.  They also said that she was unable to walk normally.  Since then, they said that she is back to her baseline normally.  She has not been sick recently.  She has had no recent fever, chills, nausea, vomiting, chest pain, shortness of breath, or abdominal pain.  She has no known history of stroke or seizure.  She does not take any medications.  They say that her bilateral lower extremities have become swollen over the course of 1 day.  She has no VTE history.  She did not syncopize.  HPI  Past Medical History:  Diagnosis Date  . Arthritis   . Diabetes mellitus   . Dyslipidemia   . Hypertension     Patient Active Problem List   Diagnosis Date Noted  . Hyperlipidemia LDL goal <70 07/31/2011  . Hypertension associated with diabetes (HCC) 07/31/2011  . Diabetes mellitus (HCC) 07/31/2011  . UNSPECIFIED ANEMIA 09/29/2008    History reviewed. No pertinent surgical history.   OB History   None      Home Medications    Prior to Admission medications   Medication Sig Start Date End Date Taking? Authorizing Provider  acetaminophen (TYLENOL) 500 MG tablet Take 500 mg by mouth every 6 (six) hours as needed for moderate pain.    [provider]  amLODipine (NORVASC) 10 MG tablet Take 1 tablet (10 mg total) by mouth daily. 03/13/18   Ronnald NianLalonde, John C, MD  atorvastatin (LIPITOR) 20 MG tablet Take 1 tablet (20 mg total) by mouth daily. 03/13/18   Ronnald NianLalonde, John C, MD  donepezil (ARICEPT) 5 MG tablet Take 1 tablet (5 mg total) by mouth at bedtime as needed. Dr.plovsky Patient not taking: Reported on 07/02/2016 01/05/14   Ronnald NianLalonde, John C, MD  lisinopril-hydrochlorothiazide (PRINZIDE,ZESTORETIC) 10-12.5 MG tablet Take 1 tablet by mouth daily. 03/13/18   Ronnald NianLalonde, John C, MD  metFORMIN (GLUCOPHAGE) 850 MG tablet TAKE 1 TABLET BY MOUTH TWICE A DAY WITH A MEAL 03/13/18   Ronnald NianLalonde, John C, MD    Family History History reviewed. No pertinent family history.  Social History Social History   Tobacco Use  . Smoking status: Former Games developermoker  . Smokeless tobacco: Never Used  Substance Use Topics  . Alcohol use: Not on file  . Drug use: Not on file     Allergies   Asa buff (mag [buffered aspirin]   Review of Systems Review of Systems Review of Systems   Constitutional  Negative for fever  Negative for chills  HENT  Negative for ear pain  Negative for sore throat  Negative for difficultly swallowing  Eyes  Negative for eye pain  Negative for visual disturbance  Respiratory  Negative for shortness  of breath  Negative for cough  CV  Negative for chest pain  Negative for leg swelling  Abdomen  Negative for abdominal pain  Negative for nausea  Negative for vomiting  MSK  Negative for extremity pain  Negative for back pain  Skin  Negative for rash  Negative for wound  Neuro  Negative for syncope  +for difficultly speaking  Psych  +for confusion   The remainder of the ROS was reviewed and negative except as documented above.      Physical Exam Updated Vital Signs BP (!) 161/76   Pulse 73   Temp 98 F (36.7 C) (Oral)   Resp 16   Ht 5\' 6"  (1.676 m)   Wt 54.5 kg   SpO2 99%   BMI 19.39 kg/m   Physical  Exam Physical Exam Constitutional  Nursing notes reviewed  Vital signs reviewed  Elderly  HEENT  No obvious trauma  Supple without meningismus, mass, or overt JVD  EOMI  No scleral icterus or injection  Respiratory  Effort normal  CTAB  No respiratory distress  Trace peripheral edema bilaterally  LE glabrous bilaterally  CV  Normal rate  No obvious murmurs  No pitting edema  Abdomen  Soft  Non-tender  Non-distended  No peritonitis  MSK  Atraumatic  No obvious deformity  ROM appropriate  Skin  Warm  Dry  Trace peripheral edema in the BLE  Neuro Component Findings  Mental Status Exam Alert and oriented Memory appropriate  Cranial Nerves   CN II Visual fields intact to confrontation  CN III, IV, VI PERRL EOMI No nystagmus.   CN V Facial sensation is normal No weakness of masticatory muscles  CN VII No facial weakness or asymmetry  CN VIII Auditory acuity grossly normal  CN IX and X Uvula is midline Palate elevates symmetrically  CN XI  Normal sternocleidomastoid and trapezius strength  CN XII The tongue is midline No tongue atrophy or fasciculations  Motor   Muscle Strength RUE: 5/5 flexion and extension RLE: 4/5 flexion and extension (chronic) LUE: 5/5 flexion and extension LLE: 5/5 flexion and extension  No pronation or drift  Muscle Tone Normal bulk and tone  Coordination Intact finger-to-nose No tremor Negative Romberg  Sensation Intact to light touch  Gait Routine gait normal (kyphosis normal)        Psychiatric  Mood and affect normal        ED Treatments / Results  Labs (all labs ordered are listed, but only abnormal results are displayed) Labs Reviewed  BASIC METABOLIC PANEL - Abnormal; Notable for the following components:      Result Value   Glucose, Bld 106 (*)    GFR calc non Af Amer 60 (*)    All other components within normal limits  CBC WITH DIFFERENTIAL/PLATELET - Abnormal; Notable for the following  components:   WBC 3.7 (*)    Hemoglobin 11.8 (*)    Neutro Abs 1.6 (*)    All other components within normal limits  URINALYSIS, ROUTINE W REFLEX MICROSCOPIC - Abnormal; Notable for the following components:   Color, Urine STRAW (*)    Hgb urine dipstick MODERATE (*)    Leukocytes, UA TRACE (*)    Bacteria, UA RARE (*)    All other components within normal limits  CBG MONITORING, ED - Abnormal; Notable for the following components:   Glucose-Capillary 114 (*)    All other components within normal limits  URINE CULTURE  HEPATIC FUNCTION PANEL  TROPONIN I  BRAIN NATRIURETIC PEPTIDE    EKG EKG Interpretation  Date/Time:  Wednesday May 07 2018 21:50:08 EDT Ventricular Rate:  80 PR Interval:    QRS Duration: 92 QT Interval:  404 QTC Calculation: 466 R Axis:   69 Text Interpretation:  Sinus rhythm No significant change since last tracing Confirmed by Melene Plan [phone]) on 05/07/2018 9:57:56 PM   Radiology Ct Head Wo Contrast  Result Date: 05/07/2018 CLINICAL DATA:  Altered level of consciousness (LOC), unexplained EXAM: CT HEAD WITHOUT CONTRAST TECHNIQUE: Contiguous axial images were obtained from the base of the skull through the vertex without intravenous contrast. COMPARISON:  Head CT 12/25/2010 FINDINGS: Brain: Generalized atrophy, progressed from 2012 exam, but normal for age. Mild to moderate chronic small vessel ischemia. No intracranial hemorrhage, mass effect, or midline shift. No hydrocephalus. The basilar cisterns are patent. No evidence of territorial infarct or acute ischemia. No extra-axial or intracranial fluid collection. Vascular: Atherosclerosis of skullbase vasculature without hyperdense vessel or abnormal calcification. Skull: No fracture or focal lesion. Sinuses/Orbits: Paranasal sinuses and mastoid air cells are clear. The visualized orbits are unremarkable. Other: None. IMPRESSION: 1.  No acute intracranial abnormality. 2. Atrophy and chronic small vessel  ischemia. Electronically Signed   By: Rubye Oaks M.D.   On: 05/07/2018 22:51   Dg Chest Port 1 View  Result Date: 05/07/2018 CLINICAL DATA:  Chest pain. EXAM: PORTABLE CHEST 1 VIEW COMPARISON:  None. FINDINGS: Cardiac silhouette is mildly enlarged. Calcified aortic knob. Increased lung volumes without pleural effusion or focal consolidation. No pneumothorax. Soft tissue planes and included osseous structures are non suspicious. Osteopenia. IMPRESSION: 1. Hyperinflation without focal consolidation. 2. Mild cardiomegaly. 3.  Aortic Atherosclerosis (ICD10-I70.0). Electronically Signed   By: Awilda Metro M.D.   On: 05/07/2018 22:42    Procedures Procedures (including critical care time)  Medications Ordered in ED Medications - No data to display   Initial Impression / Assessment and Plan / ED Course  I have reviewed the triage vital signs and the nursing notes.  Pertinent labs & imaging results that were available during my care of the patient were reviewed by me and considered in my medical decision making (see chart for details).     Beth Lovett presents due to period of altered mental status, unresponsiveness, and primarily upper extremity weakness as per above.  Currently, she has no focal neurological deficits and is at her baseline.  She is hemodynamically stable and afebrile.  Her presentation is not consistent with an acute infection, seizure, hypoglycemia, hyperglycemia, or other metabolic process.  I am primarily concerned for a TIA.  However, it would be unusual for this to be localized to the bilateral upper extremities.  We will obtain screening labs, chest x-ray, and a CT of her head.  Her head CT revealed no acute abnormalities.  I discussed this with Dr. Amada Jupiter and neurology.  We reviewed her presentation together.  He does not think that this is a TIA or stroke.  He believes that she is safe for discharge home from a neurological standpoint.  I discussed with  the family the risk and benefits of obtaining an MRI.  They declined the MRI.  She had no significant abnormalities on CBC, CMP, or troponin.  Her UA reveals few bacteria and trace LE.  She denies dysuria.  I do not think that she has a UTI.  Her BNP is 40.  Her leg swelling is minimal and not consistent with a DVT or PE.  CXR revealed no acute abnormalities.  I believe that she is safe for discharge home with her family.  She has extensive family support.  I reviewed detailed return precautions with the family.  They felt safe with her being discharged home.  They stated they would not be interested in the MRI. Final Clinical Impressions(s) / ED Diagnoses   Final diagnoses:  Weakness    ED Discharge Orders    None       Talitha GivensAshburn, Jerzy Roepke, MD 05/07/18 2348    Melene PlanFloyd, Dan, DO 05/07/18 2355

## 2018-05-09 ENCOUNTER — Encounter: Payer: Self-pay | Admitting: Family Medicine

## 2018-05-09 ENCOUNTER — Ambulatory Visit (INDEPENDENT_AMBULATORY_CARE_PROVIDER_SITE_OTHER): Payer: Medicare Other | Admitting: Family Medicine

## 2018-05-09 VITALS — BP 124/64 | HR 73 | Temp 99.0°F | Wt 115.2 lb

## 2018-05-09 DIAGNOSIS — E1159 Type 2 diabetes mellitus with other circulatory complications: Secondary | ICD-10-CM | POA: Diagnosis not present

## 2018-05-09 DIAGNOSIS — I152 Hypertension secondary to endocrine disorders: Secondary | ICD-10-CM

## 2018-05-09 DIAGNOSIS — I10 Essential (primary) hypertension: Secondary | ICD-10-CM

## 2018-05-09 DIAGNOSIS — R4189 Other symptoms and signs involving cognitive functions and awareness: Secondary | ICD-10-CM

## 2018-05-09 DIAGNOSIS — E1169 Type 2 diabetes mellitus with other specified complication: Secondary | ICD-10-CM | POA: Diagnosis not present

## 2018-05-09 DIAGNOSIS — E785 Hyperlipidemia, unspecified: Secondary | ICD-10-CM

## 2018-05-09 LAB — POCT GLYCOSYLATED HEMOGLOBIN (HGB A1C): Hemoglobin A1C: 6.2 % — AB (ref 4.0–5.6)

## 2018-05-09 LAB — URINE CULTURE

## 2018-05-09 NOTE — Progress Notes (Signed)
   Subjective:    Patient ID: Beth Kelly, female    DOB: 02-21-1934, 84 y.o.   MRN: [id number]  HPI She was seen August 21 in the emergency room.  She was noted to have some neurologic symptoms however the work-up including blood work and CT scan was essentially negative.  There is concerned about her fluid status.  She does have a sitter who helps take care of her.  There was a communication problem and after her last visit here, all of her medications were stopped.  Presently she has had no chest pain, shortness of breath, weakness, numbness or tingling.   Review of Systems     Objective:   Physical Exam Alert and in no distress.  Moves all extremities.  Throat is clear.  Neck is supple without adenopathy.  Cardiac and lung exam normal.  Blood pressure is normal.  Cognitively she is unchanged. A1c is 6.2      Assessment & Plan:  Type 2 diabetes mellitus with other specified complication, without long-term current use of insulin (HCC) - Plan: HgB A1c  Hyperlipidemia LDL goal <70 - Plan: Lipid panel  Cognitive impairment  Hypertension associated with diabetes (HCC) I explained that there was obviously a communication issue.  We would not have told him to stop taking medicines and apparently the aide thought we meant that.  At the present time she is doing fine with her blood pressure and her blood sugar.  I will follow-up on her lipid panel.  Recheck here in 4 months.  As long as she is doing fine off her medications I have no problem with this.  Over 25 minutes, greater than 50% spent in counseling and coordination of care.

## 2018-05-10 LAB — LIPID PANEL
Chol/HDL Ratio: 2.6 ratio (ref 0.0–4.4)
Cholesterol, Total: 225 mg/dL — ABNORMAL HIGH (ref 100–199)
HDL: 88 mg/dL (ref >39)
LDL CALC: 128 mg/dL — AB (ref 0–99)
Triglycerides: 43 mg/dL (ref 0–149)
VLDL CHOLESTEROL CAL: 9 mg/dL (ref 5–40)

## 2018-05-11 MED ORDER — ATORVASTATIN CALCIUM 20 MG PO TABS: 20.0000 mg | ORAL_TABLET | Freq: Every day | ORAL | 3 refills | Status: DC

## 2018-05-11 NOTE — Addendum Note (Signed)
Addended by: Ronnald NianLALONDE, Tayelor Osborne C on: 05/11/2018 06:10 PM   Modules accepted: Orders

## 2018-08-26 ENCOUNTER — Ambulatory Visit (INDEPENDENT_AMBULATORY_CARE_PROVIDER_SITE_OTHER): Payer: Medicare Other | Admitting: Family Medicine

## 2018-08-26 ENCOUNTER — Encounter: Payer: Self-pay | Admitting: Family Medicine

## 2018-08-26 VITALS — BP 128/78 | HR 72 | Temp 98.5°F | Wt 113.0 lb

## 2018-08-26 DIAGNOSIS — N3000 Acute cystitis without hematuria: Secondary | ICD-10-CM | POA: Diagnosis not present

## 2018-08-26 DIAGNOSIS — I152 Hypertension secondary to endocrine disorders: Secondary | ICD-10-CM

## 2018-08-26 DIAGNOSIS — E2839 Other primary ovarian failure: Secondary | ICD-10-CM

## 2018-08-26 DIAGNOSIS — E1169 Type 2 diabetes mellitus with other specified complication: Secondary | ICD-10-CM

## 2018-08-26 DIAGNOSIS — I10 Essential (primary) hypertension: Secondary | ICD-10-CM | POA: Diagnosis not present

## 2018-08-26 DIAGNOSIS — E785 Hyperlipidemia, unspecified: Secondary | ICD-10-CM

## 2018-08-26 DIAGNOSIS — R293 Abnormal posture: Secondary | ICD-10-CM | POA: Diagnosis not present

## 2018-08-26 DIAGNOSIS — E1159 Type 2 diabetes mellitus with other circulatory complications: Secondary | ICD-10-CM

## 2018-08-26 DIAGNOSIS — Z23 Encounter for immunization: Secondary | ICD-10-CM | POA: Diagnosis not present

## 2018-08-26 DIAGNOSIS — F0390 Unspecified dementia without behavioral disturbance: Secondary | ICD-10-CM | POA: Diagnosis not present

## 2018-08-26 LAB — POCT URINALYSIS DIP (PROADVANTAGE DEVICE)
BILIRUBIN UA: NEGATIVE
BILIRUBIN UA: NEGATIVE mg/dL
Glucose, UA: NEGATIVE mg/dL
Nitrite, UA: NEGATIVE
PH UA: 6 (ref 5.0–8.0)
Specific Gravity, Urine: 1.015
Urobilinogen, Ur: 3.5

## 2018-08-26 LAB — POCT GLYCOSYLATED HEMOGLOBIN (HGB A1C): Hemoglobin A1C: 6.9 % — AB (ref 4.0–5.6)

## 2018-08-26 MED ORDER — SULFAMETHOXAZOLE-TRIMETHOPRIM 800-160 MG PO TABS: 1.0000 | ORAL_TABLET | Freq: Two times a day (BID) | ORAL | 0 refills | Status: AC

## 2018-08-26 NOTE — Progress Notes (Addendum)
Beth Kelly is a 84 y.o. female who presents for annual wellness visit and follow-up on chronic medical conditions.  She has a Comptrollersitter with her daily that helps with all of her ADLs.  Recently she has had some difficulty with urinary incontinence.  She continues on atorvastatin but has stopped all of her other medications.  She does continue to be mobile but does walk with a bent over gait.  The caregiver states that there have been no major concerns of any kind.  Immunizations and Health Maintenance Immunization History  Administered Date(s) Administered  . Influenza Split 06/28/2011, 06/05/2012  . Influenza Whole 07/27/2009  . Influenza, High Dose Seasonal PF 07/07/2013, 05/25/2015, 07/02/2016, 05/15/2017, 08/26/2018  . Pneumococcal Conjugate-13 05/25/2015  . Pneumococcal Polysaccharide-23 07/27/2010  . Tdap 01/31/2012  . Zoster 07/01/2012   Health Maintenance Due  Topic Date Due  . DEXA SCAN  03/15/2001  . FOOT EXAM  05/24/2016  . OPHTHALMOLOGY EXAM  08/30/2017  . INFLUENZA VACCINE  04/17/2018    Last Pap smear: aged out  Last mammogram:aged out  Last colonoscopy:aged out  Last DEXA:ordered Dentist: not needed Ophtho:09-04-17 Exercise: walking around house  Other doctors caring for patient include:Dr. Groat   Advanced directives:no info given    Depression screen:  See questionnaire below.  [facility] 08/26/2018 05/15/2017 07/02/2016 07/07/2013 06/05/2012  Decreased Interest 0 0 0 0 0  Down, Depressed, Hopeless 0 0 0 0 0  PHQ - 2 Score 0 0 0 0 0    Fall Risk Screen: see questionnaire below. Fall Risk  08/26/2018 05/15/2017 07/02/2016 11/19/2014 07/07/2013  Falls in the past year? 1 No No No No  Number falls in past yr: 0 - - - -  Injury with Fall? 0 - - - -  Risk for fall due to : Other (Comment) - - - -    ADL screen:  See questionnaire below Functional Status Survey: Is the patient deaf or have difficulty hearing?: No Does the patient have  difficulty seeing, even when wearing glasses/contacts?: No Does the patient have difficulty concentrating, remembering, or making decisions?: No Does the patient have difficulty walking or climbing stairs?: No Does the patient have difficulty dressing or bathing?: No(with help) Does the patient have difficulty doing errands alone such as visiting a doctor's office or shopping?: Yes(home health)   Review of Systems Constitutional: -, -unexpected weight change, -anorexia, -fatigue Allergy: -sneezing, -itching, -congestion Dermatology: denies changing moles, rash, lumps ENT: -runny nose, -ear pain, -sore throat,  Cardiology:  -chest pain, -palpitations, -orthopnea, Respiratory: -cough, -shortness of breath, -dyspnea on exertion, -wheezing,  Gastroenterology: -abdominal pain, -nausea, -vomiting, -diarrhea, -constipation, -dysphagia Hematology: -bleeding or bruising problems Musculoskeletal: -arthralgias, -myalgias, -joint swelling, -back pain, - Ophthalmology: -vision changes,  Urology: -dysuria, -difficulty urinating,  -urinary frequency, -urgency, incontinence Neurology: -, -numbness, , -memory loss, -falls, -dizziness    PHYSICAL EXAM:   General Appearance: Alert, cooperative, no distress, appears stated age Head: Normocephalic, without obvious abnormality, atraumatic Eyes: PERRL, conjunctiva/corneas clear, EOM's intact, fundi benign Ears: Normal TM's and external ear canals Nose: Nares normal, mucosa normal, no drainage or sinus tenderness Throat: Lips, mucosa, and tongue normal; teeth and gums normal Neck: Supple, no lymphadenopathy;  thyroid:  no enlargement/tenderness/nodules; no carotid bruit or JVD Lungs: Clear to auscultation bilaterally without wheezes, rales or ronchi; respirations unlabored Heart: Regular rate and rhythm, S1 and S2 normal, no murmur, rubor gallop Abdomen: Soft, non-tender, nondistended, normoactive bowel sounds,  no masses, no  hepatosplenomegaly Extremities:  No clubbing, cyanosis or edema Pulses: 2+ and symmetric all extremities Skin:  Skin color, texture, turgor normal, no rashes or lesions Lymph nodes: Cervical, supraclavicular, and axillary nodes normal Neurologic: Disoriented to person place and time CNII-XII intact, normal strength, sensation and gait; reflexes 2+ and symmetric throughout Psych: Normal mood, affect, hygiene and grooming. Urine microscopic did show extensive bacteria with occasional red cells. ASSESSMENT/PLAN: Type 2 diabetes mellitus with other specified complication, without long-term current use of insulin (HCC) - Plan: POCT glycosylated hemoglobin (Hb A1C)  Hyperlipidemia LDL goal <70  Hypertension associated with diabetes (HCC)  Acute cystitis without hematuria - Plan: sulfamethoxazole-trimethoprim (BACTRIM DS,SEPTRA DS) 800-160 MG tablet, POCT Urinalysis DIP (Proadvantage Device), CANCELED: Urinalysis Dipstick  Need for influenza vaccination - Plan: Flu vaccine HIGH DOSE PF (Fluzone High dose)  Dementia without behavioral disturbance, unspecified dementia type (HCC)  Estrogen deficiency - Plan: DG Bone Density  Poor posture - Plan: Ambulatory referral to Physical Therapy I will put her on an antibiotic to help with the UTI and hopefully that will help with her incontinence.  We will also have physical therapy come work with her concerning proper posturing and build upper strength.  We will check a bone scan on her to be on the safe side.  No need at this time to further evaluate her mental status as she seems to be doing fairly well and apparently is easy to take care of. Discussed with the caregivers the fact that legal next of kin terms of power of attorney needs to be fully evaluated and children need to be on the same page with that.  Medicare Attestation I have personally reviewed: The patient's medical and social history Their use of alcohol, tobacco or illicit drugs Their  current medications and supplements The patient's functional ability including ADLs,fall risks, home safety risks, cognitive, and hearing and visual impairment Diet and physical activities Evidence for depression or mood disorders  The patient's weight, height, and BMI have been recorded in the chart.  I have made referrals, counseling, and provided education to the patient based on review of the above and I have provided the patient with a written personalized care plan for preventive services.     Sharlot Gowda, MD   08/26/2018

## 2018-09-16 ENCOUNTER — Ambulatory Visit: Payer: Medicare Other | Admitting: Family Medicine

## 2018-09-18 ENCOUNTER — Telehealth: Payer: Self-pay

## 2018-09-18 ENCOUNTER — Ambulatory Visit: Payer: Medicare Other | Admitting: Family Medicine

## 2018-09-18 NOTE — Telephone Encounter (Signed)
Please advise of verbal for PT in home and pt was not taking ABX as prescribed and will need another script.  Please advise [facility]

## 2018-09-19 NOTE — Telephone Encounter (Signed)
Take care of this 

## 2018-09-22 NOTE — Telephone Encounter (Signed)
Pt care giver will call back to advise if pt will need home health eval. For pt and ot in the home instead of [address] outpatient rehab. KH

## 2018-09-30 DIAGNOSIS — I739 Peripheral vascular disease, unspecified: Secondary | ICD-10-CM | POA: Diagnosis not present

## 2018-09-30 DIAGNOSIS — L84 Corns and callosities: Secondary | ICD-10-CM | POA: Diagnosis not present

## 2018-09-30 DIAGNOSIS — L603 Nail dystrophy: Secondary | ICD-10-CM | POA: Diagnosis not present

## 2018-10-02 ENCOUNTER — Other Ambulatory Visit: Payer: Self-pay

## 2018-10-02 DIAGNOSIS — R293 Abnormal posture: Secondary | ICD-10-CM

## 2018-10-07 DIAGNOSIS — R293 Abnormal posture: Secondary | ICD-10-CM | POA: Diagnosis not present

## 2018-10-07 DIAGNOSIS — Z87440 Personal history of urinary (tract) infections: Secondary | ICD-10-CM | POA: Diagnosis not present

## 2018-10-07 DIAGNOSIS — R2689 Other abnormalities of gait and mobility: Secondary | ICD-10-CM | POA: Diagnosis not present

## 2018-10-07 DIAGNOSIS — F0390 Unspecified dementia without behavioral disturbance: Secondary | ICD-10-CM | POA: Diagnosis not present

## 2018-10-07 DIAGNOSIS — E119 Type 2 diabetes mellitus without complications: Secondary | ICD-10-CM | POA: Diagnosis not present

## 2018-10-07 DIAGNOSIS — I10 Essential (primary) hypertension: Secondary | ICD-10-CM | POA: Diagnosis not present

## 2018-10-08 ENCOUNTER — Telehealth: Payer: Self-pay

## 2018-10-08 NOTE — Telephone Encounter (Signed)
Ok

## 2018-10-08 NOTE — Telephone Encounter (Signed)
Beth Kelly is requesting  Verbal order for PT 2 times a week for 4 weeks than 1 time a week for a week. Please advise. KH

## 2018-10-09 NOTE — Telephone Encounter (Signed)
LVM for Betsie to advise of ok verbal order. KH

## 2018-10-10 DIAGNOSIS — R2689 Other abnormalities of gait and mobility: Secondary | ICD-10-CM | POA: Diagnosis not present

## 2018-10-10 DIAGNOSIS — F0390 Unspecified dementia without behavioral disturbance: Secondary | ICD-10-CM | POA: Diagnosis not present

## 2018-10-10 DIAGNOSIS — Z87440 Personal history of urinary (tract) infections: Secondary | ICD-10-CM | POA: Diagnosis not present

## 2018-10-10 DIAGNOSIS — R293 Abnormal posture: Secondary | ICD-10-CM | POA: Diagnosis not present

## 2018-10-10 DIAGNOSIS — E119 Type 2 diabetes mellitus without complications: Secondary | ICD-10-CM | POA: Diagnosis not present

## 2018-10-10 DIAGNOSIS — I10 Essential (primary) hypertension: Secondary | ICD-10-CM | POA: Diagnosis not present

## 2018-10-13 DIAGNOSIS — R2689 Other abnormalities of gait and mobility: Secondary | ICD-10-CM | POA: Diagnosis not present

## 2018-10-13 DIAGNOSIS — Z87440 Personal history of urinary (tract) infections: Secondary | ICD-10-CM | POA: Diagnosis not present

## 2018-10-13 DIAGNOSIS — R293 Abnormal posture: Secondary | ICD-10-CM | POA: Diagnosis not present

## 2018-10-13 DIAGNOSIS — F0390 Unspecified dementia without behavioral disturbance: Secondary | ICD-10-CM | POA: Diagnosis not present

## 2018-10-13 DIAGNOSIS — I10 Essential (primary) hypertension: Secondary | ICD-10-CM | POA: Diagnosis not present

## 2018-10-13 DIAGNOSIS — E119 Type 2 diabetes mellitus without complications: Secondary | ICD-10-CM | POA: Diagnosis not present

## 2018-10-15 DIAGNOSIS — E119 Type 2 diabetes mellitus without complications: Secondary | ICD-10-CM | POA: Diagnosis not present

## 2018-10-15 DIAGNOSIS — F0390 Unspecified dementia without behavioral disturbance: Secondary | ICD-10-CM | POA: Diagnosis not present

## 2018-10-15 DIAGNOSIS — R2689 Other abnormalities of gait and mobility: Secondary | ICD-10-CM | POA: Diagnosis not present

## 2018-10-15 DIAGNOSIS — Z87440 Personal history of urinary (tract) infections: Secondary | ICD-10-CM | POA: Diagnosis not present

## 2018-10-15 DIAGNOSIS — I10 Essential (primary) hypertension: Secondary | ICD-10-CM | POA: Diagnosis not present

## 2018-10-15 DIAGNOSIS — R293 Abnormal posture: Secondary | ICD-10-CM | POA: Diagnosis not present

## 2018-10-20 DIAGNOSIS — R2689 Other abnormalities of gait and mobility: Secondary | ICD-10-CM | POA: Diagnosis not present

## 2018-10-20 DIAGNOSIS — F0390 Unspecified dementia without behavioral disturbance: Secondary | ICD-10-CM | POA: Diagnosis not present

## 2018-10-20 DIAGNOSIS — E119 Type 2 diabetes mellitus without complications: Secondary | ICD-10-CM | POA: Diagnosis not present

## 2018-10-20 DIAGNOSIS — R293 Abnormal posture: Secondary | ICD-10-CM | POA: Diagnosis not present

## 2018-10-20 DIAGNOSIS — I10 Essential (primary) hypertension: Secondary | ICD-10-CM | POA: Diagnosis not present

## 2018-10-20 DIAGNOSIS — Z87440 Personal history of urinary (tract) infections: Secondary | ICD-10-CM | POA: Diagnosis not present

## 2018-10-22 DIAGNOSIS — R293 Abnormal posture: Secondary | ICD-10-CM | POA: Diagnosis not present

## 2018-10-22 DIAGNOSIS — I10 Essential (primary) hypertension: Secondary | ICD-10-CM | POA: Diagnosis not present

## 2018-10-22 DIAGNOSIS — F0390 Unspecified dementia without behavioral disturbance: Secondary | ICD-10-CM | POA: Diagnosis not present

## 2018-10-22 DIAGNOSIS — E119 Type 2 diabetes mellitus without complications: Secondary | ICD-10-CM | POA: Diagnosis not present

## 2018-10-22 DIAGNOSIS — Z87440 Personal history of urinary (tract) infections: Secondary | ICD-10-CM | POA: Diagnosis not present

## 2018-10-22 DIAGNOSIS — R2689 Other abnormalities of gait and mobility: Secondary | ICD-10-CM | POA: Diagnosis not present

## 2018-10-27 DIAGNOSIS — Z87440 Personal history of urinary (tract) infections: Secondary | ICD-10-CM | POA: Diagnosis not present

## 2018-10-27 DIAGNOSIS — R293 Abnormal posture: Secondary | ICD-10-CM | POA: Diagnosis not present

## 2018-10-27 DIAGNOSIS — F0390 Unspecified dementia without behavioral disturbance: Secondary | ICD-10-CM | POA: Diagnosis not present

## 2018-10-27 DIAGNOSIS — E119 Type 2 diabetes mellitus without complications: Secondary | ICD-10-CM | POA: Diagnosis not present

## 2018-10-27 DIAGNOSIS — I10 Essential (primary) hypertension: Secondary | ICD-10-CM | POA: Diagnosis not present

## 2018-10-27 DIAGNOSIS — R2689 Other abnormalities of gait and mobility: Secondary | ICD-10-CM | POA: Diagnosis not present

## 2018-10-28 ENCOUNTER — Telehealth: Payer: Self-pay

## 2018-10-28 NOTE — Telephone Encounter (Signed)
ok 

## 2018-10-28 NOTE — Telephone Encounter (Signed)
Need verbal for extend pt 2 x a week for 2 weeks and than 1 x a week for 2 weeks . Please advise. KH

## 2018-10-28 NOTE — Telephone Encounter (Signed)
LVM . KH 

## 2018-10-29 DIAGNOSIS — E119 Type 2 diabetes mellitus without complications: Secondary | ICD-10-CM | POA: Diagnosis not present

## 2018-10-29 DIAGNOSIS — I10 Essential (primary) hypertension: Secondary | ICD-10-CM | POA: Diagnosis not present

## 2018-10-29 DIAGNOSIS — Z87440 Personal history of urinary (tract) infections: Secondary | ICD-10-CM | POA: Diagnosis not present

## 2018-10-29 DIAGNOSIS — F0390 Unspecified dementia without behavioral disturbance: Secondary | ICD-10-CM | POA: Diagnosis not present

## 2018-10-29 DIAGNOSIS — R293 Abnormal posture: Secondary | ICD-10-CM | POA: Diagnosis not present

## 2018-10-29 DIAGNOSIS — R2689 Other abnormalities of gait and mobility: Secondary | ICD-10-CM | POA: Diagnosis not present

## 2018-11-05 DIAGNOSIS — E119 Type 2 diabetes mellitus without complications: Secondary | ICD-10-CM | POA: Diagnosis not present

## 2018-11-05 DIAGNOSIS — I10 Essential (primary) hypertension: Secondary | ICD-10-CM | POA: Diagnosis not present

## 2018-11-05 DIAGNOSIS — R2689 Other abnormalities of gait and mobility: Secondary | ICD-10-CM | POA: Diagnosis not present

## 2018-11-05 DIAGNOSIS — F0390 Unspecified dementia without behavioral disturbance: Secondary | ICD-10-CM | POA: Diagnosis not present

## 2018-11-05 DIAGNOSIS — R293 Abnormal posture: Secondary | ICD-10-CM | POA: Diagnosis not present

## 2018-11-05 DIAGNOSIS — Z87440 Personal history of urinary (tract) infections: Secondary | ICD-10-CM | POA: Diagnosis not present

## 2018-11-06 DIAGNOSIS — I10 Essential (primary) hypertension: Secondary | ICD-10-CM | POA: Diagnosis not present

## 2018-11-06 DIAGNOSIS — E119 Type 2 diabetes mellitus without complications: Secondary | ICD-10-CM | POA: Diagnosis not present

## 2018-11-06 DIAGNOSIS — Z87440 Personal history of urinary (tract) infections: Secondary | ICD-10-CM | POA: Diagnosis not present

## 2018-11-06 DIAGNOSIS — F0390 Unspecified dementia without behavioral disturbance: Secondary | ICD-10-CM | POA: Diagnosis not present

## 2018-11-06 DIAGNOSIS — R2689 Other abnormalities of gait and mobility: Secondary | ICD-10-CM | POA: Diagnosis not present

## 2018-11-06 DIAGNOSIS — R293 Abnormal posture: Secondary | ICD-10-CM | POA: Diagnosis not present

## 2018-11-07 DIAGNOSIS — R2689 Other abnormalities of gait and mobility: Secondary | ICD-10-CM | POA: Diagnosis not present

## 2018-11-07 DIAGNOSIS — Z87440 Personal history of urinary (tract) infections: Secondary | ICD-10-CM | POA: Diagnosis not present

## 2018-11-07 DIAGNOSIS — F0390 Unspecified dementia without behavioral disturbance: Secondary | ICD-10-CM | POA: Diagnosis not present

## 2018-11-07 DIAGNOSIS — I10 Essential (primary) hypertension: Secondary | ICD-10-CM | POA: Diagnosis not present

## 2018-11-07 DIAGNOSIS — R293 Abnormal posture: Secondary | ICD-10-CM | POA: Diagnosis not present

## 2018-11-07 DIAGNOSIS — E119 Type 2 diabetes mellitus without complications: Secondary | ICD-10-CM | POA: Diagnosis not present

## 2018-11-11 DIAGNOSIS — F0390 Unspecified dementia without behavioral disturbance: Secondary | ICD-10-CM | POA: Diagnosis not present

## 2018-11-11 DIAGNOSIS — E119 Type 2 diabetes mellitus without complications: Secondary | ICD-10-CM | POA: Diagnosis not present

## 2018-11-11 DIAGNOSIS — R293 Abnormal posture: Secondary | ICD-10-CM | POA: Diagnosis not present

## 2018-11-11 DIAGNOSIS — R2689 Other abnormalities of gait and mobility: Secondary | ICD-10-CM | POA: Diagnosis not present

## 2018-11-11 DIAGNOSIS — I10 Essential (primary) hypertension: Secondary | ICD-10-CM | POA: Diagnosis not present

## 2018-11-11 DIAGNOSIS — Z87440 Personal history of urinary (tract) infections: Secondary | ICD-10-CM | POA: Diagnosis not present

## 2018-11-13 DIAGNOSIS — Z87440 Personal history of urinary (tract) infections: Secondary | ICD-10-CM | POA: Diagnosis not present

## 2018-11-13 DIAGNOSIS — F0390 Unspecified dementia without behavioral disturbance: Secondary | ICD-10-CM | POA: Diagnosis not present

## 2018-11-13 DIAGNOSIS — I10 Essential (primary) hypertension: Secondary | ICD-10-CM | POA: Diagnosis not present

## 2018-11-13 DIAGNOSIS — R293 Abnormal posture: Secondary | ICD-10-CM | POA: Diagnosis not present

## 2018-11-13 DIAGNOSIS — E119 Type 2 diabetes mellitus without complications: Secondary | ICD-10-CM | POA: Diagnosis not present

## 2018-11-13 DIAGNOSIS — R2689 Other abnormalities of gait and mobility: Secondary | ICD-10-CM | POA: Diagnosis not present

## 2018-11-17 DIAGNOSIS — R293 Abnormal posture: Secondary | ICD-10-CM | POA: Diagnosis not present

## 2018-11-17 DIAGNOSIS — Z87440 Personal history of urinary (tract) infections: Secondary | ICD-10-CM | POA: Diagnosis not present

## 2018-11-17 DIAGNOSIS — R2689 Other abnormalities of gait and mobility: Secondary | ICD-10-CM | POA: Diagnosis not present

## 2018-11-17 DIAGNOSIS — E119 Type 2 diabetes mellitus without complications: Secondary | ICD-10-CM | POA: Diagnosis not present

## 2018-11-17 DIAGNOSIS — F0390 Unspecified dementia without behavioral disturbance: Secondary | ICD-10-CM | POA: Diagnosis not present

## 2018-11-17 DIAGNOSIS — I10 Essential (primary) hypertension: Secondary | ICD-10-CM | POA: Diagnosis not present

## 2018-11-26 DIAGNOSIS — F0390 Unspecified dementia without behavioral disturbance: Secondary | ICD-10-CM | POA: Diagnosis not present

## 2018-11-26 DIAGNOSIS — R293 Abnormal posture: Secondary | ICD-10-CM | POA: Diagnosis not present

## 2018-11-26 DIAGNOSIS — Z87440 Personal history of urinary (tract) infections: Secondary | ICD-10-CM | POA: Diagnosis not present

## 2018-11-26 DIAGNOSIS — R2689 Other abnormalities of gait and mobility: Secondary | ICD-10-CM | POA: Diagnosis not present

## 2018-11-26 DIAGNOSIS — E119 Type 2 diabetes mellitus without complications: Secondary | ICD-10-CM | POA: Diagnosis not present

## 2018-11-26 DIAGNOSIS — I10 Essential (primary) hypertension: Secondary | ICD-10-CM | POA: Diagnosis not present

## 2018-11-28 ENCOUNTER — Other Ambulatory Visit: Payer: Medicare Other

## 2018-12-23 ENCOUNTER — Telehealth: Payer: Self-pay

## 2018-12-23 NOTE — Telephone Encounter (Signed)
Advised son of appt 12-30-18. Pt son advised her nurse would be able to  [facility]   App. Awaiting a call back.

## 2018-12-30 ENCOUNTER — Ambulatory Visit (INDEPENDENT_AMBULATORY_CARE_PROVIDER_SITE_OTHER): Payer: Medicare Other | Admitting: Family Medicine

## 2018-12-30 ENCOUNTER — Encounter: Payer: Self-pay | Admitting: Family Medicine

## 2018-12-30 ENCOUNTER — Other Ambulatory Visit: Payer: Self-pay

## 2018-12-30 VITALS — HR 77

## 2018-12-30 DIAGNOSIS — I10 Essential (primary) hypertension: Secondary | ICD-10-CM | POA: Diagnosis not present

## 2018-12-30 DIAGNOSIS — E785 Hyperlipidemia, unspecified: Secondary | ICD-10-CM

## 2018-12-30 DIAGNOSIS — I152 Hypertension secondary to endocrine disorders: Secondary | ICD-10-CM

## 2018-12-30 DIAGNOSIS — F0390 Unspecified dementia without behavioral disturbance: Secondary | ICD-10-CM

## 2018-12-30 DIAGNOSIS — E1159 Type 2 diabetes mellitus with other circulatory complications: Secondary | ICD-10-CM | POA: Diagnosis not present

## 2018-12-30 DIAGNOSIS — E1169 Type 2 diabetes mellitus with other specified complication: Secondary | ICD-10-CM | POA: Diagnosis not present

## 2018-12-30 NOTE — Progress Notes (Signed)
Subjective:    Patient ID: Beth Kelly, female    DOB: 01-Dec-1933, 85 y.o.   MRN: [id number]     Subjective:    Patient ID: Beth Kelly, female    DOB: [birth year], 85 y.o.   MRN: [id number]  Beth Kelly is a 85 y.o. female who presents for follow-up of Type 2 diabetes mellitus. Documentation for virtual telephone encounter.  Documentation for virtual audio and video telecommunications through Zoom encounter: The patient was located at home. The provider was located in the office. The patient did consent to this visit and is aware of possible charges through their insurance for this visit. The other persons participating in this telemedicine service were none.  Judeth Cornfield This virtual service is not related to other E/M service within previous 7 days. Patient is checking home blood sugars.   Home blood sugar records: meter records  How often is blood sugars being checked: once a week post meal 113-200 Current symptoms/problems include none at this time. Daily foot checks: yes   Any foot concerns: none at this time Last eye exam: 04/2018 Exercise: PT for 8 weeks, working in yard I reviewed the medications and although there is a comment about her not taking them, I believe she is actually taken the medications.  She does have people who help with her care and recently did have a lot of physical therapy done which I think helped.  Her weight seems to be relatively stable.  We did stop metformin on her last visit as her A1c was looking quite good.  There were no other concerns or complaints. The following portions of the patient's history were reviewed and updated as appropriate: allergies, current medications, past medical history, past social history and problem list.  ROS as in subjective above.     Objective:    Physical Exam Alert and in no distress otherwise not examined.   Lab Review Diabetic Labs Latest Ref Rng & Units 08/26/2018 05/09/2018 05/07/2018 03/14/2018 03/13/2018   HbA1c 4.0 - 5.6 % 6.9(A) 6.2(A) - 6.2(A) -  Microalbumin mg/L - - - - -  Micro/Creat Ratio - - - - - -  Chol 100 - 199 mg/dL - 032(Z) - - 224(M)  HDL >39 mg/dL - 88 - - 92  Calc LDL 0 - 99 mg/dL - 250(I) - - 370(W)  Triglycerides 0 - 149 mg/dL - 43 - - 51  Creatinine 0.44 - 1.00 mg/dL - - 8.88 - 9.16   BP/Weight 08/26/2018 05/09/2018 05/08/2018 05/07/2018 04/23/2018  Systolic BP 128 124 148 - 168  Diastolic BP 78 64 76 - 72  Wt. (Lbs) 113 115.2 - 120.15 120  BMI 18.24 18.59 - 19.39 19.37   Foot/eye exam completion dates Latest Ref Rng & Units 08/30/2016 07/21/2015  Eye Exam No Retinopathy No Retinopathy No Retinopathy  Foot Form Completion - - -    Rickie  reports that she has quit smoking. She has never used smokeless tobacco.     Assessment & Plan:    Type 2 diabetes mellitus with other specified complication, without long-term current use of insulin (HCC)  Hyperlipidemia LDL goal <70  Hypertension associated with diabetes (HCC)  Dementia without behavioral disturbance, unspecified dementia type (HCC)   1. Rx changes: none 2. Education: Reviewed 'ABCs' of diabetes management (respective goals in parentheses):  A1C (<7), blood pressure (<130/80), and cholesterol (LDL <100). 3. Compliance at present is estimated to be satisfactory. Efforts to improve compliance (if necessary) will be directed  at Maintaining her eating habits. 4. Follow up: 4 months It is always a challenge to find out exactly what is going on with her has her dementia does interfere.  She will return here in 4 months for a recheck.

## 2019-03-12 ENCOUNTER — Other Ambulatory Visit: Payer: Self-pay

## 2019-03-12 ENCOUNTER — Ambulatory Visit (INDEPENDENT_AMBULATORY_CARE_PROVIDER_SITE_OTHER): Payer: Medicare Other | Admitting: Family Medicine

## 2019-03-12 ENCOUNTER — Encounter: Payer: Self-pay | Admitting: Family Medicine

## 2019-03-12 VITALS — BP 112/70 | HR 78 | Temp 98.0°F | Wt 107.0 lb

## 2019-03-12 DIAGNOSIS — S2002XA Contusion of left breast, initial encounter: Secondary | ICD-10-CM

## 2019-03-12 NOTE — Progress Notes (Signed)
   Subjective:    Patient ID: Beth Kelly, female    DOB: 1933-09-20, 85 y.o.   MRN: [id number]  HPI She has a bruise present on her left breast but does not remember any injury to that area.  The caregiver is also concerned about her weight and some slight swelling in her legs.   Review of Systems     Objective:   Physical Exam Alert and in no distress.  A 1 x 3 cm ecchymotic areas noted on the left breast.  No underlying masses palpable. Minimal dependent edema is noted.      Assessment & Plan:   Encounter Diagnosis  Name Primary?  . Contusion of left breast, initial encounter Yes  Recommend heat to the area 2 or 3 times per day with a hot washcloth. Encouraged continued use of Ensure which she apparently is getting several times per day. Explained that the edema is really very minimal and if we do anything we should use elastic hose.

## 2019-05-22 ENCOUNTER — Telehealth: Payer: Self-pay | Admitting: Family Medicine

## 2019-05-22 MED ORDER — SULFAMETHOXAZOLE-TRIMETHOPRIM 800-160 MG PO TABS: ORAL_TABLET | ORAL | 0 refills | Status: AC

## 2019-05-22 NOTE — Telephone Encounter (Signed)
Beth Kelly care giver  Called and they noticed blood in her depends and last time this happened she had UTI and wanted to see what could be done  ?rx  Please call  [phone]

## 2019-05-22 NOTE — Telephone Encounter (Signed)
I have never seen her for this problem but I am willing to send in an antibiotic if they are suspicious she may have a urinary tract infection.  If she gets worse over the weekend such as fever, chills, vomiting, severe pain then she should be seen at an urgent care or emergency department.  Please send then Bactrim 800/160 mg twice daily x5 days.  #10, no refills

## 2019-05-22 NOTE — Telephone Encounter (Signed)
Beth Kelly was informed of provider note and she was also informed that the medication has been sent to the pharmacy.

## 2019-06-03 ENCOUNTER — Telehealth: Payer: Self-pay | Admitting: Family Medicine

## 2019-06-03 NOTE — Telephone Encounter (Signed)
ok 

## 2019-06-03 NOTE — Telephone Encounter (Signed)
Pt's son called and states he needs a referral to home health care. He can be reached at [phone].

## 2019-06-04 ENCOUNTER — Other Ambulatory Visit: Payer: Self-pay

## 2019-06-04 DIAGNOSIS — F0390 Unspecified dementia without behavioral disturbance: Secondary | ICD-10-CM

## 2019-06-26 DIAGNOSIS — Z23 Encounter for immunization: Secondary | ICD-10-CM | POA: Diagnosis not present

## 2019-07-21 ENCOUNTER — Telehealth: Payer: Self-pay

## 2019-07-21 NOTE — Telephone Encounter (Signed)
Pt son was called to schedule a virtual for home health referral. [facility]

## 2019-07-27 ENCOUNTER — Telehealth: Payer: Self-pay

## 2019-07-27 NOTE — Telephone Encounter (Signed)
Called pt son to advise we need an appt . For home health referral [facility]

## 2019-07-29 ENCOUNTER — Other Ambulatory Visit: Payer: Self-pay

## 2019-07-29 ENCOUNTER — Ambulatory Visit (INDEPENDENT_AMBULATORY_CARE_PROVIDER_SITE_OTHER): Payer: Medicare Other | Admitting: Family Medicine

## 2019-07-29 ENCOUNTER — Encounter: Payer: Self-pay | Admitting: Family Medicine

## 2019-07-29 VITALS — BP 120/78 | Temp 98.9°F

## 2019-07-29 DIAGNOSIS — F0390 Unspecified dementia without behavioral disturbance: Secondary | ICD-10-CM

## 2019-07-29 DIAGNOSIS — E785 Hyperlipidemia, unspecified: Secondary | ICD-10-CM

## 2019-07-29 DIAGNOSIS — E1159 Type 2 diabetes mellitus with other circulatory complications: Secondary | ICD-10-CM | POA: Diagnosis not present

## 2019-07-29 DIAGNOSIS — D649 Anemia, unspecified: Secondary | ICD-10-CM

## 2019-07-29 DIAGNOSIS — I10 Essential (primary) hypertension: Secondary | ICD-10-CM | POA: Diagnosis not present

## 2019-07-29 DIAGNOSIS — E1169 Type 2 diabetes mellitus with other specified complication: Secondary | ICD-10-CM | POA: Diagnosis not present

## 2019-07-29 DIAGNOSIS — R293 Abnormal posture: Secondary | ICD-10-CM

## 2019-07-29 LAB — POCT GLYCOSYLATED HEMOGLOBIN (HGB A1C): Hemoglobin A1C: 6.7 % — AB (ref 4.0–5.6)

## 2019-07-29 MED ORDER — ATORVASTATIN CALCIUM 20 MG PO TABS: 20.0000 mg | ORAL_TABLET | Freq: Every day | ORAL | 3 refills | Status: AC

## 2019-07-29 MED ORDER — LISINOPRIL 5 MG PO TABS: 5.0000 mg | ORAL_TABLET | Freq: Every day | ORAL | 3 refills | Status: DC

## 2019-07-29 NOTE — Progress Notes (Signed)
   Subjective:    Patient ID: Beth Kelly, female    DOB: 1933-11-08, 85 y.o.   MRN: [id number]  HPI She is here for a recheck.  She is brought in by her caregiver who works with [employer] [facility].  They are helping with meals, laundry, bathing, cleaning, medications.  Marinus Maw has no particular concerns.  The caregiver also states she is fairly easy to take care of.  Presently she has only taking her Lipitor.  She no longer takes Aricept.  She has no particular concerns or complaints.   Review of Systems     Objective:   Physical Exam Alert and in no distress. Tympanic membranes and canals are normal. Pharyngeal area is normal. Neck is supple without adenopathy or thyromegaly. Cardiac exam shows a regular sinus rhythm without murmurs or gallops. Lungs are clear to auscultation. Hemoglobin A1c is 6.9       Assessment & Plan:  Type 2 diabetes mellitus with other specified complication, without long-term current use of insulin (HCC)  Dementia without behavioral disturbance, unspecified dementia type ([place])  Poor posture  Hyperlipidemia LDL goal <70  Hypertension associated with diabetes ([place])  Anemia, unspecified type I will set her up for home health to assess any other healthcare needs she might have..  I will also renew her Lipitor and give her low-dose lisinopril more for renal protection.  She seems to be doing quite nicely just taking these medications.

## 2019-07-30 LAB — COMPREHENSIVE METABOLIC PANEL
ALT: 11 IU/L (ref 0–32)
AST: 17 IU/L (ref 0–40)
Albumin/Globulin Ratio: 1.4 (ref 1.2–2.2)
Albumin: 4 g/dL (ref 3.6–4.6)
Alkaline Phosphatase: 89 IU/L (ref 39–117)
BUN/Creatinine Ratio: 20 (ref 12–28)
BUN: 15 mg/dL (ref 8–27)
Bilirubin Total: 0.4 mg/dL (ref 0.0–1.2)
CO2: 28 mmol/L (ref 20–29)
Calcium: 9.2 mg/dL (ref 8.7–10.3)
Chloride: 100 mmol/L (ref 96–106)
Creatinine, Ser: 0.74 mg/dL (ref 0.57–1.00)
GFR calc Af Amer: 87 mL/min/{1.73_m2} (ref >59)
GFR calc non Af Amer: 75 mL/min/{1.73_m2} (ref >59)
Globulin, Total: 2.8 g/dL (ref 1.5–4.5)
Glucose: 227 mg/dL — ABNORMAL HIGH (ref 65–99)
Potassium: 4.4 mmol/L (ref 3.5–5.2)
Sodium: 139 mmol/L (ref 134–144)
Total Protein: 6.8 g/dL (ref 6.0–8.5)

## 2019-07-30 LAB — CBC WITH DIFFERENTIAL/PLATELET
Basophils Absolute: 0 10*3/uL (ref 0.0–0.2)
Basos: 1 %
EOS (ABSOLUTE): 0.1 10*3/uL (ref 0.0–0.4)
Eos: 1 %
Hematocrit: 34.8 % (ref 34.0–46.6)
Hemoglobin: 11.4 g/dL (ref 11.1–15.9)
Immature Grans (Abs): 0 10*3/uL (ref 0.0–0.1)
Immature Granulocytes: 0 %
Lymphocytes Absolute: 1.1 10*3/uL (ref 0.7–3.1)
Lymphs: 29 %
MCH: 29.3 pg (ref 26.6–33.0)
MCHC: 32.8 g/dL (ref 31.5–35.7)
MCV: 90 fL (ref 79–97)
Monocytes Absolute: 0.5 10*3/uL (ref 0.1–0.9)
Monocytes: 14 %
Neutrophils Absolute: 2.1 10*3/uL (ref 1.4–7.0)
Neutrophils: 55 %
Platelets: 179 10*3/uL (ref 150–450)
RBC: 3.89 x10E6/uL (ref 3.77–5.28)
RDW: 14 % (ref 11.7–15.4)
WBC: 3.7 10*3/uL (ref 3.4–10.8)

## 2019-07-30 LAB — LIPID PANEL
Chol/HDL Ratio: 2.8 ratio (ref 0.0–4.4)
Cholesterol, Total: 224 mg/dL — ABNORMAL HIGH (ref 100–199)
HDL: 80 mg/dL (ref >39)
LDL Chol Calc (NIH): 133 mg/dL — ABNORMAL HIGH (ref 0–99)
Triglycerides: 64 mg/dL (ref 0–149)
VLDL Cholesterol Cal: 11 mg/dL (ref 5–40)

## 2019-07-31 ENCOUNTER — Telehealth: Payer: Self-pay

## 2019-07-31 NOTE — Telephone Encounter (Signed)
Beth Kelly was given verbal

## 2019-07-31 NOTE — Telephone Encounter (Signed)
She needs an accessment of what she needs

## 2019-07-31 NOTE — Telephone Encounter (Signed)
Beth Kelly with [facility] called about the home health referral for the pt. For dementia, there was no specific type of home health nursing on the referral. They assume that she needs skilled nursing but they do need a verbal order for that if someone could call from the office for the verbal order # [id number] to speak to [place].

## 2019-08-01 DIAGNOSIS — F0390 Unspecified dementia without behavioral disturbance: Secondary | ICD-10-CM | POA: Diagnosis not present

## 2019-08-19 DIAGNOSIS — L603 Nail dystrophy: Secondary | ICD-10-CM | POA: Diagnosis not present

## 2019-08-19 DIAGNOSIS — L84 Corns and callosities: Secondary | ICD-10-CM | POA: Diagnosis not present

## 2019-08-19 DIAGNOSIS — I739 Peripheral vascular disease, unspecified: Secondary | ICD-10-CM | POA: Diagnosis not present

## 2019-09-01 ENCOUNTER — Telehealth: Payer: Self-pay

## 2019-09-01 NOTE — Telephone Encounter (Signed)
A nurse from [facility] called to let us know that Beth Kelly is being discharged from home health and the pt. Has met all of her goals.  

## 2019-09-25 ENCOUNTER — Encounter: Payer: Self-pay | Admitting: Family Medicine

## 2019-09-25 ENCOUNTER — Other Ambulatory Visit: Payer: Self-pay

## 2019-09-25 ENCOUNTER — Ambulatory Visit (INDEPENDENT_AMBULATORY_CARE_PROVIDER_SITE_OTHER): Payer: Medicare Other | Admitting: Family Medicine

## 2019-09-25 DIAGNOSIS — R159 Full incontinence of feces: Secondary | ICD-10-CM

## 2019-09-25 NOTE — Progress Notes (Signed)
   Subjective:    Patient ID: Beth Kelly, female    DOB: 01-12-1934, 86 y.o.   MRN: [id number]  HPI Documentation for virtual telephone encounter.  Documentation for virtual audio and video telecommunications through [employer] encounter: The patient was located at home. The provider was located in the office. The patient did consent to this visit and is aware of possible charges through their insurance for this visit. The other persons participating in this telemedicine service was stephanie This virtual service is not related to other E/M service within previous 7 days. Judeth Cornfield states that over the last 3 weeks Notnamed has been stooling at night.  She does have depends.  She has had no accidents during the day.  No fever, chills, abdominal pain, nausea, vomiting or diarrhea.  She does not complain of any other problems.   Review of Systems     Objective:   Physical Exam She is alert and does not appear in any distress.       Assessment & Plan:  Incontinence of feces, unspecified fecal incontinence type I explained that since she seems to be only having trouble at night and having no other symptoms during the day, lets see if we can get her on a regular schedule especially at night to see if we can get her to have a BM to help minimize this.  Also recommend the use vinyl sheets to prevent any damage to the mattress.  They will keep me informed concerning this

## 2020-01-20 DIAGNOSIS — M71571 Other bursitis, not elsewhere classified, right ankle and foot: Secondary | ICD-10-CM | POA: Diagnosis not present

## 2020-04-05 ENCOUNTER — Other Ambulatory Visit: Payer: Self-pay

## 2020-04-05 ENCOUNTER — Telehealth: Payer: Medicare Other | Admitting: Family Medicine

## 2020-04-07 ENCOUNTER — Telehealth (INDEPENDENT_AMBULATORY_CARE_PROVIDER_SITE_OTHER): Payer: Medicare Other | Admitting: Family Medicine

## 2020-04-07 ENCOUNTER — Encounter: Payer: Self-pay | Admitting: Family Medicine

## 2020-04-07 ENCOUNTER — Other Ambulatory Visit: Payer: Self-pay

## 2020-04-07 VITALS — BP 120/79 | Wt 107.0 lb

## 2020-04-07 DIAGNOSIS — F0390 Unspecified dementia without behavioral disturbance: Secondary | ICD-10-CM

## 2020-04-07 DIAGNOSIS — R159 Full incontinence of feces: Secondary | ICD-10-CM | POA: Diagnosis not present

## 2020-04-07 DIAGNOSIS — M79641 Pain in right hand: Secondary | ICD-10-CM

## 2020-04-07 NOTE — Progress Notes (Signed)
   Subjective:    Patient ID: Beth Kelly, female    DOB: 1933/10/22, 86 y.o.   MRN: [id number]  HPI I connected with  Beth Kelly on 04/07/20 by a video enabled telemedicine application and verified that I am speaking with the correct person using two identifiers. I discussed the limitations of evaluation and management by telemedicine. The patient expressed understanding and agreed to proceed.  Her caregivers Nettie Elm and Judeth Cornfield were with her during this conversation. She continues have difficulty with loose BMs.  They have been trying to help her with readjusting her dietary supplements with minimal results.  She is using depends to help keep this under control.  She continues on lisinopril and atorvastatin.  She does complain of a 3-week history of right hand pain and apparently clenches her fists a lot which makes it difficult to use that hand and they are having difficulty manually straightening it out.  Review of Systems     Objective:   Physical Exam She is alert and indicates no distress.      Assessment & Plan:  Incontinence of feces, unspecified fecal incontinence type  Dementia without behavioral disturbance, unspecified dementia type (HCC)  Right hand pain Recommend that they try using Metamucil to help give more bulk to her stool and see if that will help.  Also they are to return here for an exam so I can see what is happening with her wrist.

## 2020-04-13 ENCOUNTER — Other Ambulatory Visit: Payer: Self-pay

## 2020-04-13 ENCOUNTER — Ambulatory Visit (INDEPENDENT_AMBULATORY_CARE_PROVIDER_SITE_OTHER): Payer: Medicare Other | Admitting: Family Medicine

## 2020-04-13 ENCOUNTER — Encounter: Payer: Self-pay | Admitting: Family Medicine

## 2020-04-13 VITALS — BP 130/82 | HR 90 | Temp 98.4°F

## 2020-04-13 DIAGNOSIS — M79642 Pain in left hand: Secondary | ICD-10-CM

## 2020-04-13 NOTE — Progress Notes (Signed)
   Subjective:    Patient ID: Beth Kelly, female    DOB: [birth year], 86 y.o.   MRN: [id number]  HPI She is here for consult concerning her left hand.  She keeps it in a clenched position and there is a question as to whether there is any injury to it or not. Doing this when they wash her.  Review of Systems     Objective:   Physical Exam Very difficult to examine her left hand as she fought any motion.  I was able to manually stretch the fingers out so no evidence of contracture was noted.  No palpable tenderness to the bones or joints.  Skin appears normal.       Assessment & Plan:  Pain of left hand I see no reason as to why she keeps her hand flexed.  Encouraged him to stretch it out as much as she will allow as often as possible.  Apparently they can have an easier task

## 2020-04-26 ENCOUNTER — Emergency Department (HOSPITAL_COMMUNITY): Payer: Medicare Other

## 2020-04-26 ENCOUNTER — Other Ambulatory Visit: Payer: Self-pay

## 2020-04-26 ENCOUNTER — Encounter (HOSPITAL_COMMUNITY): Payer: Self-pay | Admitting: Emergency Medicine

## 2020-04-26 ENCOUNTER — Emergency Department (HOSPITAL_COMMUNITY)
Admission: EM | Admit: 2020-04-26 | Discharge: 2020-04-26 | Disposition: A | Discharge status: Home / Self Care | Payer: Medicare Other | Attending: Emergency Medicine | Admitting: Emergency Medicine

## 2020-04-26 DIAGNOSIS — M25561 Pain in right knee: Secondary | ICD-10-CM | POA: Diagnosis not present

## 2020-04-26 DIAGNOSIS — E119 Type 2 diabetes mellitus without complications: Secondary | ICD-10-CM | POA: Insufficient documentation

## 2020-04-26 DIAGNOSIS — I10 Essential (primary) hypertension: Secondary | ICD-10-CM | POA: Insufficient documentation

## 2020-04-26 DIAGNOSIS — S199XXA Unspecified injury of neck, initial encounter: Secondary | ICD-10-CM | POA: Diagnosis not present

## 2020-04-26 DIAGNOSIS — M7989 Other specified soft tissue disorders: Secondary | ICD-10-CM | POA: Diagnosis not present

## 2020-04-26 DIAGNOSIS — S8991XA Unspecified injury of right lower leg, initial encounter: Secondary | ICD-10-CM | POA: Diagnosis not present

## 2020-04-26 DIAGNOSIS — Z79899 Other long term (current) drug therapy: Secondary | ICD-10-CM | POA: Insufficient documentation

## 2020-04-26 DIAGNOSIS — Z87891 Personal history of nicotine dependence: Secondary | ICD-10-CM | POA: Diagnosis not present

## 2020-04-26 DIAGNOSIS — M79661 Pain in right lower leg: Secondary | ICD-10-CM | POA: Diagnosis not present

## 2020-04-26 DIAGNOSIS — W19XXXA Unspecified fall, initial encounter: Secondary | ICD-10-CM | POA: Insufficient documentation

## 2020-04-26 DIAGNOSIS — F0390 Unspecified dementia without behavioral disturbance: Secondary | ICD-10-CM | POA: Insufficient documentation

## 2020-04-26 DIAGNOSIS — Z7984 Long term (current) use of oral hypoglycemic drugs: Secondary | ICD-10-CM | POA: Diagnosis not present

## 2020-04-26 DIAGNOSIS — S0990XA Unspecified injury of head, initial encounter: Secondary | ICD-10-CM | POA: Diagnosis not present

## 2020-04-26 LAB — CBC WITH DIFFERENTIAL/PLATELET
Abs Immature Granulocytes: 0.02 10*3/uL (ref 0.00–0.07)
Basophils Absolute: 0 10*3/uL (ref 0.0–0.1)
Basophils Relative: 0 %
Eosinophils Absolute: 0 10*3/uL (ref 0.0–0.5)
Eosinophils Relative: 1 %
HCT: 38.2 % (ref 36.0–46.0)
Hemoglobin: 12 g/dL (ref 12.0–15.0)
Immature Granulocytes: 0 %
Lymphocytes Relative: 22 %
Lymphs Abs: 1.1 10*3/uL (ref 0.7–4.0)
MCH: 29.9 pg (ref 26.0–34.0)
MCHC: 31.4 g/dL (ref 30.0–36.0)
MCV: 95.3 fL (ref 80.0–100.0)
Monocytes Absolute: 0.7 10*3/uL (ref 0.1–1.0)
Monocytes Relative: 14 %
Neutro Abs: 3.3 10*3/uL (ref 1.7–7.7)
Neutrophils Relative %: 63 %
Platelets: 191 10*3/uL (ref 150–400)
RBC: 4.01 MIL/uL (ref 3.87–5.11)
RDW: 14.5 % (ref 11.5–15.5)
WBC: 5.2 10*3/uL (ref 4.0–10.5)
nRBC: 0 % (ref 0.0–0.2)

## 2020-04-26 LAB — COMPREHENSIVE METABOLIC PANEL
ALT: 11 U/L (ref 0–44)
AST: 18 U/L (ref 15–41)
Albumin: 3.8 g/dL (ref 3.5–5.0)
Alkaline Phosphatase: 76 U/L (ref 38–126)
Anion gap: 9 (ref 5–15)
BUN: 19 mg/dL (ref 8–23)
CO2: 30 mmol/L (ref 22–32)
Calcium: 9 mg/dL (ref 8.9–10.3)
Chloride: 106 mmol/L (ref 98–111)
Creatinine, Ser: 0.71 mg/dL (ref 0.44–1.00)
GFR calc Af Amer: >60 mL/min (ref >60)
GFR calc non Af Amer: >60 mL/min (ref >60)
Glucose, Bld: 316 mg/dL — ABNORMAL HIGH (ref 70–99)
Potassium: 3.7 mmol/L (ref 3.5–5.1)
Sodium: 145 mmol/L (ref 135–145)
Total Bilirubin: 0.5 mg/dL (ref 0.3–1.2)
Total Protein: 7.2 g/dL (ref 6.5–8.1)

## 2020-04-26 MED ORDER — ACETAMINOPHEN 325 MG PO TABS: 650.0000 mg | ORAL_TABLET | Freq: Once | ORAL | Status: DC

## 2020-04-26 NOTE — ED Triage Notes (Signed)
Patient had a fall x1 week ago, has had R knee pain since then, unable to bear weight. Patient is demented and poor historian.

## 2020-04-26 NOTE — Discharge Instructions (Signed)
Your workup today was overall reassuring.  Your scan did not show any evidence of fractures or head injuries.  Your scan did note that you had some nodules or spots on your thyroid.  This is nonemergent, however please have this further evaluated with your primary care doctor.  Please make sure to arrange for physical therapy as well.  Return to the ER if your symptoms worsen.

## 2020-04-26 NOTE — ED Notes (Signed)
Pt difficulty ambulating with 2 staff assist. Difficulty following commands. Used steady to assist pt to the bathroom.

## 2020-04-26 NOTE — ED Provider Notes (Addendum)
[facility]-EMERGENCY DEPT Provider Note   CSN: [id number] Arrival date & time: 04/26/20  1222     History No chief complaint on file.   Beth Kelly is a 86 y.o. female.  HPI LEVEL 5 CAVEAT 2/2 TO DEMENTIA 86 year old female with a history of DM type II, dyslipidemia, hypertension, dementia presents to the ER after a fall which occurred approximately week ago.  History is largely limited, caregiver at bedside, patient is alert to self but has significant dementia and not following commands.  I was able to speak with the patient's son, however he is not the one who lives with her.  I attempted to contact the one who does not there was no answer.  Apparently the patient per the caregiver has been having ongoing difficulty/refusing to walk over the last month.  Caregiver is also noted that she has been having a tremor at baseline, and is more difficult to understand.  These changes have been gradual however.  She states that she followed up with her PCP about this and the PCP stated that these are normal changes with dementia.  Caregiver was not there when the patient fell, however she thinks that she must have rolled out of her bed which she does fairly frequently.  Unclear if there was any LOC or head injury.  Speaking with the patient son, he would like to have her further evaluated as to why she has not been walking as per usual.  He reports that she was having physical therapy come to the house but not recently.  Patient is awake, alert, moving all 4 extremities.    Past Medical History:  Diagnosis Date  . Arthritis   . Diabetes mellitus   . Dyslipidemia   . Hypertension     Patient Active Problem List   Diagnosis Date Noted  . Hyperlipidemia LDL goal <70 07/31/2011  . Hypertension associated with diabetes (HCC) 07/31/2011  . Diabetes mellitus (HCC) 07/31/2011  . UNSPECIFIED ANEMIA 09/29/2008    History reviewed. No pertinent surgical history.   OB History    No obstetric history on file.     No family history on file.  Social History   Tobacco Use  . Smoking status: Former Games developer  . Smokeless tobacco: Never Used  Substance Use Topics  . Alcohol use: Not on file  . Drug use: Not on file    Home Medications Prior to Admission medications   Medication Sig Start Date End Date Taking? Authorizing Provider  acetaminophen (TYLENOL) 500 MG tablet Take 500 mg by mouth every 6 (six) hours as needed for moderate pain.    [provider]  amLODipine (NORVASC) 10 MG tablet Take 1 tablet (10 mg total) by mouth daily. Patient not taking: Reported on 05/09/2018 03/13/18   Ronnald Nian, MD  atorvastatin (LIPITOR) 20 MG tablet Take 1 tablet (20 mg total) by mouth daily. 07/29/19   Ronnald Nian, MD  donepezil (ARICEPT) 5 MG tablet Take 1 tablet (5 mg total) by mouth at bedtime as needed. Dr.plovsky Patient not taking: Reported on 12/30/2018 01/05/14   Ronnald Nian, MD  lisinopril (ZESTRIL) 5 MG tablet Take 1 tablet (5 mg total) by mouth daily. 07/29/19   Ronnald Nian, MD  lisinopril-hydrochlorothiazide (PRINZIDE,ZESTORETIC) 10-12.5 MG tablet Take 1 tablet by mouth daily. Patient not taking: Reported on 05/09/2018 03/13/18   Ronnald Nian, MD  metFORMIN (GLUCOPHAGE) 850 MG tablet TAKE 1 TABLET BY MOUTH TWICE A DAY WITH  A MEAL Patient not taking: Reported on 08/26/2018 03/13/18   Ronnald Nian, MD  sulfamethoxazole-trimethoprim (BACTRIM DS) 800-160 MG tablet Take 1 tablet twice daily for 5 days. Patient not taking: Reported on 07/29/2019 05/22/19   Hetty Blend L, NP-C  sulfamethoxazole-trimethoprim (BACTRIM DS,SEPTRA DS) 800-160 MG tablet Take 1 tablet by mouth 2 (two) times daily. Patient not taking: Reported on 12/30/2018 08/26/18   Ronnald Nian, MD    Allergies    Asa buff (mag [buffered aspirin]  Review of Systems   Review of Systems  Unable to perform ROS: Dementia    Physical Exam Updated Vital Signs BP 132/70 (BP  Location: Right Arm)   Pulse 91   Temp 98.7 F (37.1 C) (Oral)   Resp 16   Ht 5\' 6"  (1.676 m)   Wt 49 kg   SpO2 100%   BMI 17.44 kg/m   Physical Exam Vitals reviewed.  Constitutional:      General: She is not in acute distress.    Appearance: Normal appearance. She is not ill-appearing, toxic-appearing or diaphoretic.  HENT:     Head: Normocephalic and atraumatic.  Eyes:     General:        Right eye: No discharge.        Left eye: No discharge.     Extraocular Movements: Extraocular movements intact.     Conjunctiva/sclera: Conjunctivae normal.  Cardiovascular:     Rate and Rhythm: Normal rate and regular rhythm.     Pulses: Normal pulses.     Heart sounds: Normal heart sounds.  Pulmonary:     Effort: Pulmonary effort is normal.     Breath sounds: Normal breath sounds.  Abdominal:     General: Abdomen is flat.     Tenderness: There is no abdominal tenderness.  Musculoskeletal:        General: Tenderness present. No swelling, deformity or signs of injury. Normal range of motion.     Cervical back: Normal range of motion.     Right lower leg: No edema.     Left lower leg: No edema.     Comments: Physical exam difficult to perform as the patient does not follow commands.  She is able to lift her left and right leg up.  She does have full passive range of motion of her knee, however does report tenderness to palpation to the right knee.  No significant evidence of injury, ecchymosis, erythema, warmth. No TTP to the hips, no midline tenderness to C, T L spine.   Skin:    General: Skin is warm and dry.     Findings: No bruising, erythema or lesion.  Neurological:     General: No focal deficit present.     Mental Status: She is alert.     Comments: A&Ox1. Moving all 4 extremities.  Legs equal length, no external or internal rotation evident   Psychiatric:        Mood and Affect: Mood normal.        Behavior: Behavior normal.     ED Results / Procedures / Treatments     Labs (all labs ordered are listed, but only abnormal results are displayed) Labs Reviewed  COMPREHENSIVE METABOLIC PANEL - Abnormal; Notable for the following components:      Result Value   Glucose, Bld 316 (*)    All other components within normal limits  CBC WITH DIFFERENTIAL/PLATELET  URINALYSIS, ROUTINE W REFLEX MICROSCOPIC    EKG EKG Interpretation  Date/Time:  Tuesday April 26 2020 18:44:53 EDT Ventricular Rate:  92 PR Interval:  138 QRS Duration: 64 QT Interval:  362 QTC Calculation: 447 R Axis:   81 Text Interpretation: Sinus rhythm Nonspecific ST and T wave abnormality Abnormal ECG Confirmed by Tilden Fossa [phone]) on 04/26/2020 6:58:20 PM   Radiology CT Head Wo Contrast  Result Date: 04/26/2020 CLINICAL DATA:  86 year old female with fall. EXAM: CT HEAD WITHOUT CONTRAST CT CERVICAL SPINE WITHOUT CONTRAST TECHNIQUE: Multidetector CT imaging of the head and cervical spine was performed following the standard protocol without intravenous contrast. Multiplanar CT image reconstructions of the cervical spine were also generated. COMPARISON:  Head CT dated 05/07/2018. FINDINGS: CT HEAD FINDINGS Brain: Mild age-related atrophy and chronic microvascular ischemic changes. There is no acute intracranial hemorrhage. No mass effect or midline shift. No extra-axial fluid collection. Vascular: No hyperdense vessel or unexpected calcification. Skull: Normal. Negative for fracture or focal lesion. Sinuses/Orbits: No acute finding. Other: None CT CERVICAL SPINE FINDINGS Alignment: No acute subluxation. Skull base and vertebrae: No acute fracture Soft tissues and spinal canal: No prevertebral fluid or swelling. No visible canal hematoma. Disc levels:  No acute findings. Mild degenerative changes Upper chest: Large bilateral thyroid hypodense nodules. The right thyroid nodule measures 3 cm. Recommend thyroid US (ref: J Am Coll Radiol. 2015 Feb;12(2): 143-50). Other: Bilateral carotid bulb  calcified plaques. IMPRESSION: 1. No acute intracranial pathology. Mild age-related atrophy and chronic microvascular ischemic changes. 2. No acute/traumatic cervical spine pathology. 3. Large bilateral thyroid hypodense nodules. Recommend thyroid US (ref: J Am Coll Radiol. Electronically Signed   By: Elgie Collard M.D.   On: 04/26/2020 18:40   CT Cervical Spine Wo Contrast  Result Date: 04/26/2020 CLINICAL DATA:  86 year old female with fall. EXAM: CT HEAD WITHOUT CONTRAST CT CERVICAL SPINE WITHOUT CONTRAST TECHNIQUE: Multidetector CT imaging of the head and cervical spine was performed following the standard protocol without intravenous contrast. Multiplanar CT image reconstructions of the cervical spine were also generated. COMPARISON:  Head CT dated 05/07/2018. FINDINGS: CT HEAD FINDINGS Brain: Mild age-related atrophy and chronic microvascular ischemic changes. There is no acute intracranial hemorrhage. No mass effect or midline shift. No extra-axial fluid collection. Vascular: No hyperdense vessel or unexpected calcification. Skull: Normal. Negative for fracture or focal lesion. Sinuses/Orbits: No acute finding. Other: None CT CERVICAL SPINE FINDINGS Alignment: No acute subluxation. Skull base and vertebrae: No acute fracture Soft tissues and spinal canal: No prevertebral fluid or swelling. No visible canal hematoma. Disc levels:  No acute findings. Mild degenerative changes Upper chest: Large bilateral thyroid hypodense nodules. The right thyroid nodule measures 3 cm. Recommend thyroid US (ref: J Am Coll Radiol. 2015 Feb;12(2): 143-50). Other: Bilateral carotid bulb calcified plaques. IMPRESSION: 1. No acute intracranial pathology. Mild age-related atrophy and chronic microvascular ischemic changes. 2. No acute/traumatic cervical spine pathology. 3. Large bilateral thyroid hypodense nodules. Recommend thyroid US (ref: J Am Coll Radiol. Electronically Signed   By: Elgie Collard M.D.   On: 04/26/2020  18:40   CT Knee Right Wo Contrast  Result Date: 04/26/2020 CLINICAL DATA:  Fall 1 week ago. Right knee pain since. Unable to bear weight. EXAM: CT OF THE RIGHT KNEE WITHOUT CONTRAST TECHNIQUE: Multidetector CT imaging of the RIGHT knee was performed according to the standard protocol. Multiplanar CT image reconstructions were also generated. COMPARISON:  Knee radiographs obtained earlier today FINDINGS: Bones/Joint/Cartilage No convincing fracture. No bone lesion. Skeletal structures are diffusely demineralized. There is tricompartmental joint space narrowing with small marginal osteophytes.  No joint effusion. Ligaments Cruciate ligaments appear intact. Collateral ligaments not well-defined. Muscles and Tendons Quadriceps and patellar tendons are intact. No visualized tendon or muscle injury. Soft tissues No soft tissue edema. IMPRESSION: 1. No fracture. No CT evidence of an acute abnormality. No joint effusion. Electronically Signed   By: Amie Portlandavid  Ormond M.D.   On: 04/26/2020 18:54   DG Knee Complete 4 Views Right  Result Date: 04/26/2020 CLINICAL DATA:  Right knee pain and swelling since fall week ago. EXAM: RIGHT KNEE - COMPLETE 4+ VIEW COMPARISON:  None. FINDINGS: No acute fracture or dislocation. No joint effusion. Mild lateral compartment joint space narrowing with small marginal osteophytes. Osteopenia. Soft tissues are unremarkable. IMPRESSION: 1. No acute osseous abnormality. 2. Mild lateral compartment osteoarthritis. Electronically Signed   By: Obie DredgeWilliam T Derry M.D.   On: 04/26/2020 15:39   DG Hips Bilat W or Wo Pelvis 5 Views  Result Date: 04/26/2020 CLINICAL DATA:  86 year old female with right lower extremity pain. EXAM: DG HIP (WITH OR WITHOUT PELVIS) 5+V BILAT COMPARISON:  None. FINDINGS: There is no acute fracture or dislocation. The bones are osteopenic. Mild bilateral hip arthritic changes. Lower lumbar degenerative changes. The soft tissues are unremarkable. Large amount of stool noted  throughout the colon. IMPRESSION: No acute fracture or dislocation. Electronically Signed   By: Elgie CollardArash  Radparvar M.D.   On: 04/26/2020 18:45    Procedures Procedures (including critical care time)  Medications Ordered in ED Medications  acetaminophen (TYLENOL) tablet 650 mg (has no administration in time range)    ED Course  I have reviewed the triage vital signs and the nursing notes.  Pertinent labs & imaging results that were available during my care of the patient were reviewed by me and considered in my medical decision making (see chart for details).    MDM Rules/Calculators/A&P                         86 year old female with a fall approximately a week ago, difficult to elicit history On presentation, she is alert, oriented to self, but not following commands.  Per caregiver at bedside, this seems to be her baseline.  Physical exam is difficult to perform as she does not follow commands, however she does not have any evidence of leg shortening, she is still able to move all 4 extremities.  She does have some tenderness to palpation over the right knee, but no excessive erythema, swelling, bruising.  LABS:  I personally reviewed and interpreted her labs CMP without any electrolyte abnormalities, normal renal function and liver function.   CBC without leukocytosis, normal hemoglobin.  IMAGING:  CT of the right knee, head, cervical spine obtained.  No acute evidence of injuries, however she was noted to have a large thyroid nodules.   Plain films of her right knee and bilateral hips without evidence of acute fractures or dislocations.  EKG normal sinus rhythm with nonspecific ST and T wave abnormalities, reviewed by Dr. Madilyn Hookees  MDM: Per the caregiver, the patient has been having ongoing difficulty/refusal to ambulate.  Her imaging is without evidence of acute injury.  Nursing staff attempted to ambulate the patient, however the patient refused and was not following commands.  I  spoke with the patient's son Simona Huharl, who states they have the adequate support at home to take care of her.  Discussed physical therapy, he states that they will follow up with her primary care doctor as he is aware of her  situation.  He states they will arrange this through him.  Do not think there are any acute causes of her ambulation, could be worsening dementia. Unable to check a UA as the patient would not void and she was in a hallway bed with a full ER.  Encouraged Tylenol for pain.  Patient is currently stable for discharge.  Patient was also seen and evaluated by Dr. Pecola Leisure who is agreeable to the above plan and disposition  Final Clinical Impression(s) / ED Diagnoses Final diagnoses:  Fall, initial encounter    Rx / DC Orders ED Discharge Orders    None         Leone Brand 04/26/20 2104    Tilden Fossa, MD 04/27/20 2318

## 2020-05-03 ENCOUNTER — Telehealth: Payer: Self-pay

## 2020-05-03 NOTE — Telephone Encounter (Signed)
Have [facility] go by to do an evaluation

## 2020-05-03 NOTE — Telephone Encounter (Signed)
Pt. Caregiver Beth Kelly called stating that Beth Kelly went to the ER because her lt. Knee was hurting so bad so could not put weight on it or walk. They did an x-ray and said nothing was broken. They told her she would need some PT for her knee but didn't set that up for her. The caregiver wanted to know if she could call or did you guys need to order that for her. She has seen [facility] in the past for PT. Beth Kelly can be reached at [phone].

## 2020-05-04 ENCOUNTER — Other Ambulatory Visit: Payer: Self-pay

## 2020-05-04 DIAGNOSIS — M25562 Pain in left knee: Secondary | ICD-10-CM

## 2020-05-04 NOTE — Telephone Encounter (Signed)
Referral placed. [facility]

## 2020-05-08 DIAGNOSIS — E785 Hyperlipidemia, unspecified: Secondary | ICD-10-CM | POA: Diagnosis not present

## 2020-05-08 DIAGNOSIS — F0390 Unspecified dementia without behavioral disturbance: Secondary | ICD-10-CM | POA: Diagnosis not present

## 2020-05-08 DIAGNOSIS — M8580 Other specified disorders of bone density and structure, unspecified site: Secondary | ICD-10-CM | POA: Diagnosis not present

## 2020-05-08 DIAGNOSIS — M6281 Muscle weakness (generalized): Secondary | ICD-10-CM | POA: Diagnosis not present

## 2020-05-08 DIAGNOSIS — E119 Type 2 diabetes mellitus without complications: Secondary | ICD-10-CM | POA: Diagnosis not present

## 2020-05-08 DIAGNOSIS — Z9181 History of falling: Secondary | ICD-10-CM | POA: Diagnosis not present

## 2020-05-08 DIAGNOSIS — M25561 Pain in right knee: Secondary | ICD-10-CM | POA: Diagnosis not present

## 2020-05-08 DIAGNOSIS — M1711 Unilateral primary osteoarthritis, right knee: Secondary | ICD-10-CM | POA: Diagnosis not present

## 2020-05-08 DIAGNOSIS — R262 Difficulty in walking, not elsewhere classified: Secondary | ICD-10-CM | POA: Diagnosis not present

## 2020-05-08 DIAGNOSIS — D649 Anemia, unspecified: Secondary | ICD-10-CM | POA: Diagnosis not present

## 2020-05-08 DIAGNOSIS — M79642 Pain in left hand: Secondary | ICD-10-CM | POA: Diagnosis not present

## 2020-05-08 DIAGNOSIS — I10 Essential (primary) hypertension: Secondary | ICD-10-CM | POA: Diagnosis not present

## 2020-05-12 DIAGNOSIS — E119 Type 2 diabetes mellitus without complications: Secondary | ICD-10-CM | POA: Diagnosis not present

## 2020-05-12 DIAGNOSIS — M1711 Unilateral primary osteoarthritis, right knee: Secondary | ICD-10-CM | POA: Diagnosis not present

## 2020-05-12 DIAGNOSIS — M25561 Pain in right knee: Secondary | ICD-10-CM | POA: Diagnosis not present

## 2020-05-12 DIAGNOSIS — M8580 Other specified disorders of bone density and structure, unspecified site: Secondary | ICD-10-CM | POA: Diagnosis not present

## 2020-05-12 DIAGNOSIS — F0390 Unspecified dementia without behavioral disturbance: Secondary | ICD-10-CM | POA: Diagnosis not present

## 2020-05-12 DIAGNOSIS — R262 Difficulty in walking, not elsewhere classified: Secondary | ICD-10-CM | POA: Diagnosis not present

## 2020-05-13 ENCOUNTER — Telehealth: Payer: Self-pay

## 2020-05-13 NOTE — Telephone Encounter (Signed)
Pt nurse is requesting a hospital bed. Please advise. I will be out of the office next week if you could please send to the pool. Thanks [employer]

## 2020-05-16 NOTE — Telephone Encounter (Signed)
We will need someone from home health to go out and assess the need for the bed

## 2020-05-16 NOTE — Telephone Encounter (Signed)
I called [facility] and they said they would need a written order faxed over to them and there fax number is [phone].

## 2020-05-17 DIAGNOSIS — F0390 Unspecified dementia without behavioral disturbance: Secondary | ICD-10-CM | POA: Diagnosis not present

## 2020-05-17 DIAGNOSIS — M8580 Other specified disorders of bone density and structure, unspecified site: Secondary | ICD-10-CM | POA: Diagnosis not present

## 2020-05-17 DIAGNOSIS — M25561 Pain in right knee: Secondary | ICD-10-CM | POA: Diagnosis not present

## 2020-05-17 DIAGNOSIS — M1711 Unilateral primary osteoarthritis, right knee: Secondary | ICD-10-CM | POA: Diagnosis not present

## 2020-05-17 DIAGNOSIS — E119 Type 2 diabetes mellitus without complications: Secondary | ICD-10-CM | POA: Diagnosis not present

## 2020-05-17 DIAGNOSIS — R262 Difficulty in walking, not elsewhere classified: Secondary | ICD-10-CM | POA: Diagnosis not present

## 2020-05-17 NOTE — Telephone Encounter (Signed)
Ordered faxed to [facility].

## 2020-05-17 NOTE — Telephone Encounter (Signed)
I will stop by in about an hour.  Make sure you catch me

## 2020-05-19 DIAGNOSIS — M1711 Unilateral primary osteoarthritis, right knee: Secondary | ICD-10-CM | POA: Diagnosis not present

## 2020-05-19 DIAGNOSIS — M25561 Pain in right knee: Secondary | ICD-10-CM | POA: Diagnosis not present

## 2020-05-19 DIAGNOSIS — M8580 Other specified disorders of bone density and structure, unspecified site: Secondary | ICD-10-CM | POA: Diagnosis not present

## 2020-05-19 DIAGNOSIS — E119 Type 2 diabetes mellitus without complications: Secondary | ICD-10-CM | POA: Diagnosis not present

## 2020-05-19 DIAGNOSIS — F0390 Unspecified dementia without behavioral disturbance: Secondary | ICD-10-CM | POA: Diagnosis not present

## 2020-05-19 DIAGNOSIS — R262 Difficulty in walking, not elsewhere classified: Secondary | ICD-10-CM | POA: Diagnosis not present

## 2020-05-20 DIAGNOSIS — M25561 Pain in right knee: Secondary | ICD-10-CM | POA: Diagnosis not present

## 2020-05-20 DIAGNOSIS — M1711 Unilateral primary osteoarthritis, right knee: Secondary | ICD-10-CM | POA: Diagnosis not present

## 2020-05-20 DIAGNOSIS — R262 Difficulty in walking, not elsewhere classified: Secondary | ICD-10-CM | POA: Diagnosis not present

## 2020-05-20 DIAGNOSIS — E119 Type 2 diabetes mellitus without complications: Secondary | ICD-10-CM | POA: Diagnosis not present

## 2020-05-20 DIAGNOSIS — F0390 Unspecified dementia without behavioral disturbance: Secondary | ICD-10-CM | POA: Diagnosis not present

## 2020-05-20 DIAGNOSIS — M8580 Other specified disorders of bone density and structure, unspecified site: Secondary | ICD-10-CM | POA: Diagnosis not present

## 2020-05-25 DIAGNOSIS — E119 Type 2 diabetes mellitus without complications: Secondary | ICD-10-CM | POA: Diagnosis not present

## 2020-05-25 DIAGNOSIS — R262 Difficulty in walking, not elsewhere classified: Secondary | ICD-10-CM | POA: Diagnosis not present

## 2020-05-25 DIAGNOSIS — M25561 Pain in right knee: Secondary | ICD-10-CM | POA: Diagnosis not present

## 2020-05-25 DIAGNOSIS — F0390 Unspecified dementia without behavioral disturbance: Secondary | ICD-10-CM | POA: Diagnosis not present

## 2020-05-25 DIAGNOSIS — M1711 Unilateral primary osteoarthritis, right knee: Secondary | ICD-10-CM | POA: Diagnosis not present

## 2020-05-25 DIAGNOSIS — M8580 Other specified disorders of bone density and structure, unspecified site: Secondary | ICD-10-CM | POA: Diagnosis not present

## 2020-05-26 ENCOUNTER — Telehealth: Payer: Medicare Other | Admitting: Family Medicine

## 2020-05-26 ENCOUNTER — Other Ambulatory Visit: Payer: Self-pay

## 2020-05-27 DIAGNOSIS — M1711 Unilateral primary osteoarthritis, right knee: Secondary | ICD-10-CM | POA: Diagnosis not present

## 2020-05-27 DIAGNOSIS — F0390 Unspecified dementia without behavioral disturbance: Secondary | ICD-10-CM | POA: Diagnosis not present

## 2020-05-27 DIAGNOSIS — E119 Type 2 diabetes mellitus without complications: Secondary | ICD-10-CM | POA: Diagnosis not present

## 2020-05-27 DIAGNOSIS — R262 Difficulty in walking, not elsewhere classified: Secondary | ICD-10-CM | POA: Diagnosis not present

## 2020-05-27 DIAGNOSIS — M8580 Other specified disorders of bone density and structure, unspecified site: Secondary | ICD-10-CM | POA: Diagnosis not present

## 2020-05-27 DIAGNOSIS — M25561 Pain in right knee: Secondary | ICD-10-CM | POA: Diagnosis not present

## 2020-05-31 ENCOUNTER — Telehealth: Payer: Self-pay

## 2020-05-31 DIAGNOSIS — M1711 Unilateral primary osteoarthritis, right knee: Secondary | ICD-10-CM | POA: Diagnosis not present

## 2020-05-31 DIAGNOSIS — M8580 Other specified disorders of bone density and structure, unspecified site: Secondary | ICD-10-CM | POA: Diagnosis not present

## 2020-05-31 DIAGNOSIS — F0390 Unspecified dementia without behavioral disturbance: Secondary | ICD-10-CM | POA: Diagnosis not present

## 2020-05-31 DIAGNOSIS — M25561 Pain in right knee: Secondary | ICD-10-CM | POA: Diagnosis not present

## 2020-05-31 DIAGNOSIS — R262 Difficulty in walking, not elsewhere classified: Secondary | ICD-10-CM | POA: Diagnosis not present

## 2020-05-31 DIAGNOSIS — E119 Type 2 diabetes mellitus without complications: Secondary | ICD-10-CM | POA: Diagnosis not present

## 2020-05-31 NOTE — Telephone Encounter (Signed)
Almira from [facility] was calling for a verbal order for a nursing evaluation pt. Developed a pressure sore on her lt. Hip. Nino Glow can be reached at [phone].

## 2020-05-31 NOTE — Telephone Encounter (Signed)
ok 

## 2020-06-01 ENCOUNTER — Telehealth: Payer: Self-pay | Admitting: Family Medicine

## 2020-06-01 DIAGNOSIS — M1711 Unilateral primary osteoarthritis, right knee: Secondary | ICD-10-CM | POA: Diagnosis not present

## 2020-06-01 DIAGNOSIS — R262 Difficulty in walking, not elsewhere classified: Secondary | ICD-10-CM | POA: Diagnosis not present

## 2020-06-01 DIAGNOSIS — F0390 Unspecified dementia without behavioral disturbance: Secondary | ICD-10-CM | POA: Diagnosis not present

## 2020-06-01 DIAGNOSIS — M25561 Pain in right knee: Secondary | ICD-10-CM | POA: Diagnosis not present

## 2020-06-01 DIAGNOSIS — E119 Type 2 diabetes mellitus without complications: Secondary | ICD-10-CM | POA: Diagnosis not present

## 2020-06-01 DIAGNOSIS — M8580 Other specified disorders of bone density and structure, unspecified site: Secondary | ICD-10-CM | POA: Diagnosis not present

## 2020-06-01 NOTE — Telephone Encounter (Signed)
Verbal order given  

## 2020-06-01 NOTE — Telephone Encounter (Signed)
We don't have any face to face notes and pt nurse did not answer for virtual appt. We can't do anything until she has an appt. KH

## 2020-06-01 NOTE — Telephone Encounter (Signed)
Pts caregiver called and said pt has a pressure sore and was told by [facility] that she needed and order from her pcp to have home health come out

## 2020-06-01 NOTE — Telephone Encounter (Signed)
Victorino Dike from [facility] called and states that they are declining the order for the order for hosptial bed pt states they do not have any office notes she just wanted to inform us  She can be reached at [phone]

## 2020-06-01 NOTE — Telephone Encounter (Signed)
Needs an appt

## 2020-06-02 DIAGNOSIS — M25561 Pain in right knee: Secondary | ICD-10-CM | POA: Diagnosis not present

## 2020-06-02 DIAGNOSIS — F0390 Unspecified dementia without behavioral disturbance: Secondary | ICD-10-CM | POA: Diagnosis not present

## 2020-06-02 DIAGNOSIS — R262 Difficulty in walking, not elsewhere classified: Secondary | ICD-10-CM | POA: Diagnosis not present

## 2020-06-02 DIAGNOSIS — M1711 Unilateral primary osteoarthritis, right knee: Secondary | ICD-10-CM | POA: Diagnosis not present

## 2020-06-02 DIAGNOSIS — E119 Type 2 diabetes mellitus without complications: Secondary | ICD-10-CM | POA: Diagnosis not present

## 2020-06-02 DIAGNOSIS — M8580 Other specified disorders of bone density and structure, unspecified site: Secondary | ICD-10-CM | POA: Diagnosis not present

## 2020-06-02 NOTE — Telephone Encounter (Signed)
LVM for care giver to call back to schedule a appointment. Beth Kelly

## 2020-06-02 NOTE — Telephone Encounter (Signed)
Appt made. KH 

## 2020-06-03 DIAGNOSIS — E119 Type 2 diabetes mellitus without complications: Secondary | ICD-10-CM | POA: Diagnosis not present

## 2020-06-03 DIAGNOSIS — M25561 Pain in right knee: Secondary | ICD-10-CM | POA: Diagnosis not present

## 2020-06-03 DIAGNOSIS — R262 Difficulty in walking, not elsewhere classified: Secondary | ICD-10-CM | POA: Diagnosis not present

## 2020-06-03 DIAGNOSIS — M8580 Other specified disorders of bone density and structure, unspecified site: Secondary | ICD-10-CM | POA: Diagnosis not present

## 2020-06-03 DIAGNOSIS — F0390 Unspecified dementia without behavioral disturbance: Secondary | ICD-10-CM | POA: Diagnosis not present

## 2020-06-03 DIAGNOSIS — M1711 Unilateral primary osteoarthritis, right knee: Secondary | ICD-10-CM | POA: Diagnosis not present

## 2020-06-06 DIAGNOSIS — E119 Type 2 diabetes mellitus without complications: Secondary | ICD-10-CM | POA: Diagnosis not present

## 2020-06-06 DIAGNOSIS — R262 Difficulty in walking, not elsewhere classified: Secondary | ICD-10-CM | POA: Diagnosis not present

## 2020-06-06 DIAGNOSIS — M8580 Other specified disorders of bone density and structure, unspecified site: Secondary | ICD-10-CM | POA: Diagnosis not present

## 2020-06-06 DIAGNOSIS — M25561 Pain in right knee: Secondary | ICD-10-CM | POA: Diagnosis not present

## 2020-06-06 DIAGNOSIS — M1711 Unilateral primary osteoarthritis, right knee: Secondary | ICD-10-CM | POA: Diagnosis not present

## 2020-06-06 DIAGNOSIS — F0390 Unspecified dementia without behavioral disturbance: Secondary | ICD-10-CM | POA: Diagnosis not present

## 2020-06-07 DIAGNOSIS — D649 Anemia, unspecified: Secondary | ICD-10-CM | POA: Diagnosis not present

## 2020-06-07 DIAGNOSIS — M8580 Other specified disorders of bone density and structure, unspecified site: Secondary | ICD-10-CM | POA: Diagnosis not present

## 2020-06-07 DIAGNOSIS — E785 Hyperlipidemia, unspecified: Secondary | ICD-10-CM | POA: Diagnosis not present

## 2020-06-07 DIAGNOSIS — M79642 Pain in left hand: Secondary | ICD-10-CM | POA: Diagnosis not present

## 2020-06-07 DIAGNOSIS — I10 Essential (primary) hypertension: Secondary | ICD-10-CM | POA: Diagnosis not present

## 2020-06-07 DIAGNOSIS — F0390 Unspecified dementia without behavioral disturbance: Secondary | ICD-10-CM | POA: Diagnosis not present

## 2020-06-07 DIAGNOSIS — R262 Difficulty in walking, not elsewhere classified: Secondary | ICD-10-CM | POA: Diagnosis not present

## 2020-06-07 DIAGNOSIS — E119 Type 2 diabetes mellitus without complications: Secondary | ICD-10-CM | POA: Diagnosis not present

## 2020-06-07 DIAGNOSIS — Z9181 History of falling: Secondary | ICD-10-CM | POA: Diagnosis not present

## 2020-06-07 DIAGNOSIS — M6281 Muscle weakness (generalized): Secondary | ICD-10-CM | POA: Diagnosis not present

## 2020-06-07 DIAGNOSIS — M1711 Unilateral primary osteoarthritis, right knee: Secondary | ICD-10-CM | POA: Diagnosis not present

## 2020-06-07 DIAGNOSIS — M25561 Pain in right knee: Secondary | ICD-10-CM | POA: Diagnosis not present

## 2020-06-08 ENCOUNTER — Telehealth: Payer: Self-pay

## 2020-06-08 ENCOUNTER — Telehealth: Payer: Self-pay | Admitting: Family Medicine

## 2020-06-08 DIAGNOSIS — R262 Difficulty in walking, not elsewhere classified: Secondary | ICD-10-CM | POA: Diagnosis not present

## 2020-06-08 DIAGNOSIS — F0390 Unspecified dementia without behavioral disturbance: Secondary | ICD-10-CM | POA: Diagnosis not present

## 2020-06-08 DIAGNOSIS — M25561 Pain in right knee: Secondary | ICD-10-CM | POA: Diagnosis not present

## 2020-06-08 DIAGNOSIS — E119 Type 2 diabetes mellitus without complications: Secondary | ICD-10-CM | POA: Diagnosis not present

## 2020-06-08 DIAGNOSIS — M1711 Unilateral primary osteoarthritis, right knee: Secondary | ICD-10-CM | POA: Diagnosis not present

## 2020-06-08 DIAGNOSIS — M8580 Other specified disorders of bone density and structure, unspecified site: Secondary | ICD-10-CM | POA: Diagnosis not present

## 2020-06-08 NOTE — Telephone Encounter (Signed)
Forwarding

## 2020-06-08 NOTE — Telephone Encounter (Signed)
Please give written order for IV fluids to be faxed over [facility]

## 2020-06-08 NOTE — Telephone Encounter (Signed)
We are going to need a home health nurse to go out and assess her hydration status including blood pressure and urine before we can clear it.

## 2020-06-08 NOTE — Telephone Encounter (Signed)
[place] from [facility] Therapy called and states that the care giver at home is stating that Clyde is dehydrated and they are wanting to know if pt can receive at home IV fluid from the nurse, states that pt not drinking her urine is dark, and is not going to the bathroom a lot,  They really do not want to send her to the hospital due to COVID, if she can get the fluids at home please send the order to the fax number [phone].  Any questions brittany can be reached at [phone].

## 2020-06-08 NOTE — Telephone Encounter (Signed)
Home health needs to go out and evaluate the situation and decide what needs to be done

## 2020-06-08 NOTE — Telephone Encounter (Signed)
[place] with [facility] called to report that the pt. Had a pulse out of normal range it was 103.

## 2020-06-08 NOTE — Telephone Encounter (Signed)
Verbal was given to have nursing eval the need for IV fluids. [place] advised that the nurse will call to give more info after eval. [facility]

## 2020-06-09 ENCOUNTER — Other Ambulatory Visit: Payer: Self-pay

## 2020-06-09 ENCOUNTER — Telehealth: Payer: Self-pay

## 2020-06-09 ENCOUNTER — Encounter: Payer: Self-pay | Admitting: Family Medicine

## 2020-06-09 ENCOUNTER — Telehealth (INDEPENDENT_AMBULATORY_CARE_PROVIDER_SITE_OTHER): Payer: Medicare Other | Admitting: Family Medicine

## 2020-06-09 VITALS — BP 124/72 | Temp 97.6°F | Wt 97.0 lb

## 2020-06-09 DIAGNOSIS — M1711 Unilateral primary osteoarthritis, right knee: Secondary | ICD-10-CM | POA: Diagnosis not present

## 2020-06-09 DIAGNOSIS — R627 Adult failure to thrive: Secondary | ICD-10-CM

## 2020-06-09 DIAGNOSIS — R262 Difficulty in walking, not elsewhere classified: Secondary | ICD-10-CM | POA: Diagnosis not present

## 2020-06-09 DIAGNOSIS — E119 Type 2 diabetes mellitus without complications: Secondary | ICD-10-CM | POA: Diagnosis not present

## 2020-06-09 DIAGNOSIS — M8580 Other specified disorders of bone density and structure, unspecified site: Secondary | ICD-10-CM | POA: Diagnosis not present

## 2020-06-09 DIAGNOSIS — F0390 Unspecified dementia without behavioral disturbance: Secondary | ICD-10-CM | POA: Diagnosis not present

## 2020-06-09 DIAGNOSIS — M25561 Pain in right knee: Secondary | ICD-10-CM | POA: Diagnosis not present

## 2020-06-09 NOTE — Telephone Encounter (Signed)
[place] called for verbal order for speech one week one,  than two week one and finally one week two. [phone].

## 2020-06-09 NOTE — Progress Notes (Addendum)
   Subjective:    Patient ID: Beth Kelly, female    DOB: 1934/07/13, 86 y.o.   MRN: [id number]  HPI I connected with  LEISEL PINETTE on 06/09/20 by a video enabled telemedicine application and verified that I am speaking with the correct person using two identifiers.  [employer] used.  Judeth Cornfield her caregiver was on call as well as her niece Eunice Blase Pettit.l am in my office and she is in her home I discussed the limitations of evaluation and management by telemedicine. The patient expressed understanding and agreed to proceed. Vena Austria is now not walking without help.  She rarely feeds herself and relies on someone to to feed her and the food needs to be pured.  She apparently is still urinating and having BMs.  She was evaluated by [facility].  The patient does require a hospital bed to help with better positioning and to reduce the likelihood of bedsores.  She is still quite weak and mentally further deteriorated.  There is also question of her hydration and the need for IV fluids.   Review of Systems     Objective:   Physical Exam Alert and talking but not moving in the bed.       Assessment & Plan:  Dementia without behavioral disturbance, unspecified dementia type (HCC)  Failure to thrive in adult After discussion with the caregiver and her niece, a referral will be made to hospice.  We will also arrange for her to get a bed with a pressure mattress and gel cushion for the wheelchair.  There is a question of getting her better hydrated and we will expedite the referral to hospice to help with that and if that cannot be done we will get [facility] to start an IV. Over 40 minutes spent discussing all these issues with the caregiver and Gavin Pound.

## 2020-06-09 NOTE — Telephone Encounter (Signed)
Pt was eval today by Traci from [facility]. She stated pt skin is very dry, HR is up , very low urine output and urine is very dark please advise of the ok to start IV fluids. KH

## 2020-06-10 NOTE — Telephone Encounter (Signed)
ok 

## 2020-06-10 NOTE — Telephone Encounter (Signed)
Beth Kelly was out of the office and spoke to [place] and she advise fluids have to be order by you. Please advise . KH

## 2020-06-10 NOTE — Telephone Encounter (Signed)
Beth Kelly was called and advise of the ok to start iv fluids on pt. KH

## 2020-06-10 NOTE — Telephone Encounter (Signed)
[facility] was called and advised to move forward. Orders for bed and wheel chair were faxed over to [facility].  Care giver was advised to call back and schedule an virtual appt and have son on the call to go over next steps for pt. KH

## 2020-06-14 DIAGNOSIS — M25561 Pain in right knee: Secondary | ICD-10-CM | POA: Diagnosis not present

## 2020-06-14 DIAGNOSIS — M8580 Other specified disorders of bone density and structure, unspecified site: Secondary | ICD-10-CM | POA: Diagnosis not present

## 2020-06-14 DIAGNOSIS — M1711 Unilateral primary osteoarthritis, right knee: Secondary | ICD-10-CM | POA: Diagnosis not present

## 2020-06-14 DIAGNOSIS — E119 Type 2 diabetes mellitus without complications: Secondary | ICD-10-CM | POA: Diagnosis not present

## 2020-06-14 DIAGNOSIS — F0390 Unspecified dementia without behavioral disturbance: Secondary | ICD-10-CM | POA: Diagnosis not present

## 2020-06-14 DIAGNOSIS — R262 Difficulty in walking, not elsewhere classified: Secondary | ICD-10-CM | POA: Diagnosis not present

## 2020-06-15 DIAGNOSIS — F0390 Unspecified dementia without behavioral disturbance: Secondary | ICD-10-CM | POA: Diagnosis not present

## 2020-06-15 DIAGNOSIS — M1711 Unilateral primary osteoarthritis, right knee: Secondary | ICD-10-CM | POA: Diagnosis not present

## 2020-06-15 DIAGNOSIS — R262 Difficulty in walking, not elsewhere classified: Secondary | ICD-10-CM | POA: Diagnosis not present

## 2020-06-15 DIAGNOSIS — E119 Type 2 diabetes mellitus without complications: Secondary | ICD-10-CM | POA: Diagnosis not present

## 2020-06-15 DIAGNOSIS — M25561 Pain in right knee: Secondary | ICD-10-CM | POA: Diagnosis not present

## 2020-06-15 DIAGNOSIS — M8580 Other specified disorders of bone density and structure, unspecified site: Secondary | ICD-10-CM | POA: Diagnosis not present

## 2020-06-17 ENCOUNTER — Other Ambulatory Visit: Payer: Self-pay

## 2020-06-17 ENCOUNTER — Telehealth: Payer: Self-pay | Admitting: Family Medicine

## 2020-06-17 ENCOUNTER — Encounter: Payer: Self-pay | Admitting: Family Medicine

## 2020-06-17 ENCOUNTER — Telehealth (INDEPENDENT_AMBULATORY_CARE_PROVIDER_SITE_OTHER): Payer: Medicare Other | Admitting: Family Medicine

## 2020-06-17 VITALS — BP 148/79 | HR 105 | Ht 63.0 in | Wt 99.0 lb

## 2020-06-17 DIAGNOSIS — E119 Type 2 diabetes mellitus without complications: Secondary | ICD-10-CM | POA: Diagnosis not present

## 2020-06-17 DIAGNOSIS — F0390 Unspecified dementia without behavioral disturbance: Secondary | ICD-10-CM

## 2020-06-17 DIAGNOSIS — M1711 Unilateral primary osteoarthritis, right knee: Secondary | ICD-10-CM | POA: Diagnosis not present

## 2020-06-17 DIAGNOSIS — R627 Adult failure to thrive: Secondary | ICD-10-CM

## 2020-06-17 DIAGNOSIS — M25561 Pain in right knee: Secondary | ICD-10-CM | POA: Diagnosis not present

## 2020-06-17 DIAGNOSIS — M8580 Other specified disorders of bone density and structure, unspecified site: Secondary | ICD-10-CM | POA: Diagnosis not present

## 2020-06-17 DIAGNOSIS — R262 Difficulty in walking, not elsewhere classified: Secondary | ICD-10-CM | POA: Diagnosis not present

## 2020-06-17 NOTE — Telephone Encounter (Signed)
Beth Kelly with [facility] called. She states that she went out to see pt today and no one answered the door or phone. She called contact Beth Kelly and he wouldn't not state if pt was inside house. She also asked if pt was home alone and he would not answer. Beth Kelly then states a neighbor came over and stated that pt is left home alone. Beth Kelly would like to get a Child psychotherapist involved. She would like to know if that is ok with you. Please advise Beth Kelly at [phone].

## 2020-06-17 NOTE — Progress Notes (Signed)
   Subjective:    Patient ID: Beth Kelly, female    DOB: 1933/11/21, 86 y.o.   MRN: [id number]  HPI I connected with  ESBEIDY MCLAINE on 06/17/20 by a video enabled telemedicine application and verified that I am speaking with the correct person using two identifiers.  [employer] used. I'm in my office she is at home with her caregiver.  Her son Simona Huh was also listening via phone.  I discussed the limitations of evaluation and management by telemedicine. The patient expressed understanding and agreed to proceed. Since my last encounter with her, she has improved her eating habits as well as fluids.  The caregiver verified that.  There was a question about referral to hospice but since she seems to be doing okay, we will continue with the present situation.  Review of Systems     Objective:   Physical Exam Alert and in no distress otherwise not examined.       Assessment & Plan:  Dementia without behavioral disturbance, unspecified dementia type (HCC)  Failure to thrive in adult Approximately 30 minutes spent discussing this with Chester Holstein, the caregiver and Simona Huh.  I did call Simona Huh back and discussed this with him in detail.  I explained the fact that since she is doing okay, we will leave her alone but if she starts having difficulty with decreased eating as well as oral intake the best approach would be to allow her to die with dignity and he is totally in agreement with this and will discuss it with his siblings.

## 2020-06-17 NOTE — Telephone Encounter (Signed)
She has caregivers there and I talked Beth Kelly today so things are fine

## 2020-06-20 ENCOUNTER — Telehealth: Payer: Self-pay | Admitting: Family Medicine

## 2020-06-20 DIAGNOSIS — M1711 Unilateral primary osteoarthritis, right knee: Secondary | ICD-10-CM | POA: Diagnosis not present

## 2020-06-20 DIAGNOSIS — E119 Type 2 diabetes mellitus without complications: Secondary | ICD-10-CM | POA: Diagnosis not present

## 2020-06-20 DIAGNOSIS — F0390 Unspecified dementia without behavioral disturbance: Secondary | ICD-10-CM | POA: Diagnosis not present

## 2020-06-20 DIAGNOSIS — R262 Difficulty in walking, not elsewhere classified: Secondary | ICD-10-CM | POA: Diagnosis not present

## 2020-06-20 DIAGNOSIS — M8580 Other specified disorders of bone density and structure, unspecified site: Secondary | ICD-10-CM | POA: Diagnosis not present

## 2020-06-20 DIAGNOSIS — M25561 Pain in right knee: Secondary | ICD-10-CM | POA: Diagnosis not present

## 2020-06-20 NOTE — Telephone Encounter (Signed)
Beth Kelly with [facility] called and states that pt is being discharged from speech therapy due to goals being met. She can be reached at [phone].

## 2020-06-20 NOTE — Telephone Encounter (Signed)
Beth Kelly was called and message was relayed

## 2020-06-22 ENCOUNTER — Telehealth: Payer: Self-pay

## 2020-06-22 DIAGNOSIS — R262 Difficulty in walking, not elsewhere classified: Secondary | ICD-10-CM | POA: Diagnosis not present

## 2020-06-22 DIAGNOSIS — M25561 Pain in right knee: Secondary | ICD-10-CM | POA: Diagnosis not present

## 2020-06-22 DIAGNOSIS — F0390 Unspecified dementia without behavioral disturbance: Secondary | ICD-10-CM | POA: Diagnosis not present

## 2020-06-22 DIAGNOSIS — M1711 Unilateral primary osteoarthritis, right knee: Secondary | ICD-10-CM | POA: Diagnosis not present

## 2020-06-22 DIAGNOSIS — E119 Type 2 diabetes mellitus without complications: Secondary | ICD-10-CM | POA: Diagnosis not present

## 2020-06-22 DIAGNOSIS — M8580 Other specified disorders of bone density and structure, unspecified site: Secondary | ICD-10-CM | POA: Diagnosis not present

## 2020-06-22 NOTE — Telephone Encounter (Signed)
Patients caregiver called Palliative care SW to reschedule visit from Thu 10/14 to [facility] 10/11 @10am .

## 2020-06-22 NOTE — Telephone Encounter (Signed)
Palliative care SW outreached patients caregiver, Judeth Cornfield, to schedule initial visit. Visit confirmed for Thu 10/14 @1pm .

## 2020-06-23 DIAGNOSIS — E785 Hyperlipidemia, unspecified: Secondary | ICD-10-CM | POA: Diagnosis not present

## 2020-06-23 DIAGNOSIS — F0280 Dementia in other diseases classified elsewhere without behavioral disturbance: Secondary | ICD-10-CM | POA: Diagnosis not present

## 2020-06-23 DIAGNOSIS — I10 Essential (primary) hypertension: Secondary | ICD-10-CM | POA: Diagnosis not present

## 2020-06-23 DIAGNOSIS — G309 Alzheimer's disease, unspecified: Secondary | ICD-10-CM | POA: Diagnosis not present

## 2020-06-23 DIAGNOSIS — Z681 Body mass index (BMI) 19 or less, adult: Secondary | ICD-10-CM | POA: Diagnosis not present

## 2020-06-23 DIAGNOSIS — E46 Unspecified protein-calorie malnutrition: Secondary | ICD-10-CM | POA: Diagnosis not present

## 2020-06-23 DIAGNOSIS — L89220 Pressure ulcer of left hip, unstageable: Secondary | ICD-10-CM | POA: Diagnosis not present

## 2020-06-23 DIAGNOSIS — D649 Anemia, unspecified: Secondary | ICD-10-CM | POA: Diagnosis not present

## 2020-06-23 DIAGNOSIS — L89210 Pressure ulcer of right hip, unstageable: Secondary | ICD-10-CM | POA: Diagnosis not present

## 2020-06-24 ENCOUNTER — Telehealth: Payer: Self-pay

## 2020-06-24 DIAGNOSIS — D649 Anemia, unspecified: Secondary | ICD-10-CM | POA: Diagnosis not present

## 2020-06-24 DIAGNOSIS — I10 Essential (primary) hypertension: Secondary | ICD-10-CM | POA: Diagnosis not present

## 2020-06-24 DIAGNOSIS — G309 Alzheimer's disease, unspecified: Secondary | ICD-10-CM | POA: Diagnosis not present

## 2020-06-24 DIAGNOSIS — E785 Hyperlipidemia, unspecified: Secondary | ICD-10-CM | POA: Diagnosis not present

## 2020-06-24 DIAGNOSIS — F0280 Dementia in other diseases classified elsewhere without behavioral disturbance: Secondary | ICD-10-CM | POA: Diagnosis not present

## 2020-06-24 DIAGNOSIS — E46 Unspecified protein-calorie malnutrition: Secondary | ICD-10-CM | POA: Diagnosis not present

## 2020-06-24 NOTE — Telephone Encounter (Signed)
Palliative care SW [facility], caregiver, to inform her that due to patient now being under Hospice services that the schedule palliative care visit for Mon 10/11 will be canceled. Caregiver stated understanding.

## 2020-06-27 ENCOUNTER — Other Ambulatory Visit: Payer: Self-pay

## 2020-06-28 DIAGNOSIS — E785 Hyperlipidemia, unspecified: Secondary | ICD-10-CM | POA: Diagnosis not present

## 2020-06-28 DIAGNOSIS — F0280 Dementia in other diseases classified elsewhere without behavioral disturbance: Secondary | ICD-10-CM | POA: Diagnosis not present

## 2020-06-28 DIAGNOSIS — E46 Unspecified protein-calorie malnutrition: Secondary | ICD-10-CM | POA: Diagnosis not present

## 2020-06-28 DIAGNOSIS — I10 Essential (primary) hypertension: Secondary | ICD-10-CM | POA: Diagnosis not present

## 2020-06-28 DIAGNOSIS — G309 Alzheimer's disease, unspecified: Secondary | ICD-10-CM | POA: Diagnosis not present

## 2020-06-28 DIAGNOSIS — D649 Anemia, unspecified: Secondary | ICD-10-CM | POA: Diagnosis not present

## 2020-06-29 DIAGNOSIS — E46 Unspecified protein-calorie malnutrition: Secondary | ICD-10-CM | POA: Diagnosis not present

## 2020-06-29 DIAGNOSIS — I10 Essential (primary) hypertension: Secondary | ICD-10-CM | POA: Diagnosis not present

## 2020-06-29 DIAGNOSIS — G309 Alzheimer's disease, unspecified: Secondary | ICD-10-CM | POA: Diagnosis not present

## 2020-06-29 DIAGNOSIS — E785 Hyperlipidemia, unspecified: Secondary | ICD-10-CM | POA: Diagnosis not present

## 2020-06-29 DIAGNOSIS — F0280 Dementia in other diseases classified elsewhere without behavioral disturbance: Secondary | ICD-10-CM | POA: Diagnosis not present

## 2020-06-29 DIAGNOSIS — D649 Anemia, unspecified: Secondary | ICD-10-CM | POA: Diagnosis not present

## 2020-06-30 ENCOUNTER — Other Ambulatory Visit: Payer: Self-pay

## 2020-06-30 DIAGNOSIS — E785 Hyperlipidemia, unspecified: Secondary | ICD-10-CM | POA: Diagnosis not present

## 2020-06-30 DIAGNOSIS — I10 Essential (primary) hypertension: Secondary | ICD-10-CM | POA: Diagnosis not present

## 2020-06-30 DIAGNOSIS — G309 Alzheimer's disease, unspecified: Secondary | ICD-10-CM | POA: Diagnosis not present

## 2020-06-30 DIAGNOSIS — D649 Anemia, unspecified: Secondary | ICD-10-CM | POA: Diagnosis not present

## 2020-06-30 DIAGNOSIS — E46 Unspecified protein-calorie malnutrition: Secondary | ICD-10-CM | POA: Diagnosis not present

## 2020-06-30 DIAGNOSIS — F0280 Dementia in other diseases classified elsewhere without behavioral disturbance: Secondary | ICD-10-CM | POA: Diagnosis not present

## 2020-07-01 DIAGNOSIS — G309 Alzheimer's disease, unspecified: Secondary | ICD-10-CM | POA: Diagnosis not present

## 2020-07-01 DIAGNOSIS — D649 Anemia, unspecified: Secondary | ICD-10-CM | POA: Diagnosis not present

## 2020-07-01 DIAGNOSIS — F0280 Dementia in other diseases classified elsewhere without behavioral disturbance: Secondary | ICD-10-CM | POA: Diagnosis not present

## 2020-07-01 DIAGNOSIS — I10 Essential (primary) hypertension: Secondary | ICD-10-CM | POA: Diagnosis not present

## 2020-07-01 DIAGNOSIS — E46 Unspecified protein-calorie malnutrition: Secondary | ICD-10-CM | POA: Diagnosis not present

## 2020-07-01 DIAGNOSIS — E785 Hyperlipidemia, unspecified: Secondary | ICD-10-CM | POA: Diagnosis not present

## 2020-07-02 DIAGNOSIS — F0280 Dementia in other diseases classified elsewhere without behavioral disturbance: Secondary | ICD-10-CM | POA: Diagnosis not present

## 2020-07-02 DIAGNOSIS — G309 Alzheimer's disease, unspecified: Secondary | ICD-10-CM | POA: Diagnosis not present

## 2020-07-02 DIAGNOSIS — I10 Essential (primary) hypertension: Secondary | ICD-10-CM | POA: Diagnosis not present

## 2020-07-02 DIAGNOSIS — E46 Unspecified protein-calorie malnutrition: Secondary | ICD-10-CM | POA: Diagnosis not present

## 2020-07-02 DIAGNOSIS — D649 Anemia, unspecified: Secondary | ICD-10-CM | POA: Diagnosis not present

## 2020-07-02 DIAGNOSIS — E785 Hyperlipidemia, unspecified: Secondary | ICD-10-CM | POA: Diagnosis not present

## 2020-07-03 DIAGNOSIS — E46 Unspecified protein-calorie malnutrition: Secondary | ICD-10-CM | POA: Diagnosis not present

## 2020-07-03 DIAGNOSIS — F0280 Dementia in other diseases classified elsewhere without behavioral disturbance: Secondary | ICD-10-CM | POA: Diagnosis not present

## 2020-07-03 DIAGNOSIS — I10 Essential (primary) hypertension: Secondary | ICD-10-CM | POA: Diagnosis not present

## 2020-07-03 DIAGNOSIS — E785 Hyperlipidemia, unspecified: Secondary | ICD-10-CM | POA: Diagnosis not present

## 2020-07-03 DIAGNOSIS — D649 Anemia, unspecified: Secondary | ICD-10-CM | POA: Diagnosis not present

## 2020-07-03 DIAGNOSIS — G309 Alzheimer's disease, unspecified: Secondary | ICD-10-CM | POA: Diagnosis not present

## 2020-07-04 DIAGNOSIS — F0280 Dementia in other diseases classified elsewhere without behavioral disturbance: Secondary | ICD-10-CM | POA: Diagnosis not present

## 2020-07-04 DIAGNOSIS — I10 Essential (primary) hypertension: Secondary | ICD-10-CM | POA: Diagnosis not present

## 2020-07-04 DIAGNOSIS — D649 Anemia, unspecified: Secondary | ICD-10-CM | POA: Diagnosis not present

## 2020-07-04 DIAGNOSIS — E785 Hyperlipidemia, unspecified: Secondary | ICD-10-CM | POA: Diagnosis not present

## 2020-07-04 DIAGNOSIS — G309 Alzheimer's disease, unspecified: Secondary | ICD-10-CM | POA: Diagnosis not present

## 2020-07-04 DIAGNOSIS — E46 Unspecified protein-calorie malnutrition: Secondary | ICD-10-CM | POA: Diagnosis not present

## 2020-07-05 DIAGNOSIS — G309 Alzheimer's disease, unspecified: Secondary | ICD-10-CM | POA: Diagnosis not present

## 2020-07-05 DIAGNOSIS — D649 Anemia, unspecified: Secondary | ICD-10-CM | POA: Diagnosis not present

## 2020-07-05 DIAGNOSIS — E785 Hyperlipidemia, unspecified: Secondary | ICD-10-CM | POA: Diagnosis not present

## 2020-07-05 DIAGNOSIS — F0280 Dementia in other diseases classified elsewhere without behavioral disturbance: Secondary | ICD-10-CM | POA: Diagnosis not present

## 2020-07-05 DIAGNOSIS — E46 Unspecified protein-calorie malnutrition: Secondary | ICD-10-CM | POA: Diagnosis not present

## 2020-07-05 DIAGNOSIS — I10 Essential (primary) hypertension: Secondary | ICD-10-CM | POA: Diagnosis not present

## 2020-07-06 DIAGNOSIS — D649 Anemia, unspecified: Secondary | ICD-10-CM | POA: Diagnosis not present

## 2020-07-06 DIAGNOSIS — F0280 Dementia in other diseases classified elsewhere without behavioral disturbance: Secondary | ICD-10-CM | POA: Diagnosis not present

## 2020-07-06 DIAGNOSIS — G309 Alzheimer's disease, unspecified: Secondary | ICD-10-CM | POA: Diagnosis not present

## 2020-07-06 DIAGNOSIS — E785 Hyperlipidemia, unspecified: Secondary | ICD-10-CM | POA: Diagnosis not present

## 2020-07-06 DIAGNOSIS — I10 Essential (primary) hypertension: Secondary | ICD-10-CM | POA: Diagnosis not present

## 2020-07-06 DIAGNOSIS — E46 Unspecified protein-calorie malnutrition: Secondary | ICD-10-CM | POA: Diagnosis not present

## 2020-07-07 DIAGNOSIS — E46 Unspecified protein-calorie malnutrition: Secondary | ICD-10-CM | POA: Diagnosis not present

## 2020-07-07 DIAGNOSIS — D649 Anemia, unspecified: Secondary | ICD-10-CM | POA: Diagnosis not present

## 2020-07-07 DIAGNOSIS — I10 Essential (primary) hypertension: Secondary | ICD-10-CM | POA: Diagnosis not present

## 2020-07-07 DIAGNOSIS — G309 Alzheimer's disease, unspecified: Secondary | ICD-10-CM | POA: Diagnosis not present

## 2020-07-07 DIAGNOSIS — E785 Hyperlipidemia, unspecified: Secondary | ICD-10-CM | POA: Diagnosis not present

## 2020-07-07 DIAGNOSIS — F0280 Dementia in other diseases classified elsewhere without behavioral disturbance: Secondary | ICD-10-CM | POA: Diagnosis not present

## 2020-07-08 DIAGNOSIS — F0280 Dementia in other diseases classified elsewhere without behavioral disturbance: Secondary | ICD-10-CM | POA: Diagnosis not present

## 2020-07-08 DIAGNOSIS — E46 Unspecified protein-calorie malnutrition: Secondary | ICD-10-CM | POA: Diagnosis not present

## 2020-07-08 DIAGNOSIS — I10 Essential (primary) hypertension: Secondary | ICD-10-CM | POA: Diagnosis not present

## 2020-07-08 DIAGNOSIS — D649 Anemia, unspecified: Secondary | ICD-10-CM | POA: Diagnosis not present

## 2020-07-08 DIAGNOSIS — E785 Hyperlipidemia, unspecified: Secondary | ICD-10-CM | POA: Diagnosis not present

## 2020-07-08 DIAGNOSIS — G309 Alzheimer's disease, unspecified: Secondary | ICD-10-CM | POA: Diagnosis not present

## 2020-07-09 DIAGNOSIS — E785 Hyperlipidemia, unspecified: Secondary | ICD-10-CM | POA: Diagnosis not present

## 2020-07-09 DIAGNOSIS — E46 Unspecified protein-calorie malnutrition: Secondary | ICD-10-CM | POA: Diagnosis not present

## 2020-07-09 DIAGNOSIS — D649 Anemia, unspecified: Secondary | ICD-10-CM | POA: Diagnosis not present

## 2020-07-09 DIAGNOSIS — I10 Essential (primary) hypertension: Secondary | ICD-10-CM | POA: Diagnosis not present

## 2020-07-09 DIAGNOSIS — F0280 Dementia in other diseases classified elsewhere without behavioral disturbance: Secondary | ICD-10-CM | POA: Diagnosis not present

## 2020-07-09 DIAGNOSIS — G309 Alzheimer's disease, unspecified: Secondary | ICD-10-CM | POA: Diagnosis not present

## 2020-07-10 DIAGNOSIS — F0280 Dementia in other diseases classified elsewhere without behavioral disturbance: Secondary | ICD-10-CM | POA: Diagnosis not present

## 2020-07-10 DIAGNOSIS — D649 Anemia, unspecified: Secondary | ICD-10-CM | POA: Diagnosis not present

## 2020-07-10 DIAGNOSIS — G309 Alzheimer's disease, unspecified: Secondary | ICD-10-CM | POA: Diagnosis not present

## 2020-07-10 DIAGNOSIS — E785 Hyperlipidemia, unspecified: Secondary | ICD-10-CM | POA: Diagnosis not present

## 2020-07-10 DIAGNOSIS — I10 Essential (primary) hypertension: Secondary | ICD-10-CM | POA: Diagnosis not present

## 2020-07-10 DIAGNOSIS — E46 Unspecified protein-calorie malnutrition: Secondary | ICD-10-CM | POA: Diagnosis not present

## 2020-07-11 DIAGNOSIS — E785 Hyperlipidemia, unspecified: Secondary | ICD-10-CM | POA: Diagnosis not present

## 2020-07-11 DIAGNOSIS — I10 Essential (primary) hypertension: Secondary | ICD-10-CM | POA: Diagnosis not present

## 2020-07-11 DIAGNOSIS — D649 Anemia, unspecified: Secondary | ICD-10-CM | POA: Diagnosis not present

## 2020-07-11 DIAGNOSIS — F0280 Dementia in other diseases classified elsewhere without behavioral disturbance: Secondary | ICD-10-CM | POA: Diagnosis not present

## 2020-07-11 DIAGNOSIS — E46 Unspecified protein-calorie malnutrition: Secondary | ICD-10-CM | POA: Diagnosis not present

## 2020-07-11 DIAGNOSIS — G309 Alzheimer's disease, unspecified: Secondary | ICD-10-CM | POA: Diagnosis not present

## 2020-07-12 DIAGNOSIS — F0280 Dementia in other diseases classified elsewhere without behavioral disturbance: Secondary | ICD-10-CM | POA: Diagnosis not present

## 2020-07-12 DIAGNOSIS — I10 Essential (primary) hypertension: Secondary | ICD-10-CM | POA: Diagnosis not present

## 2020-07-12 DIAGNOSIS — E785 Hyperlipidemia, unspecified: Secondary | ICD-10-CM | POA: Diagnosis not present

## 2020-07-12 DIAGNOSIS — G309 Alzheimer's disease, unspecified: Secondary | ICD-10-CM | POA: Diagnosis not present

## 2020-07-12 DIAGNOSIS — E46 Unspecified protein-calorie malnutrition: Secondary | ICD-10-CM | POA: Diagnosis not present

## 2020-07-12 DIAGNOSIS — D649 Anemia, unspecified: Secondary | ICD-10-CM | POA: Diagnosis not present

## 2020-07-13 DIAGNOSIS — E785 Hyperlipidemia, unspecified: Secondary | ICD-10-CM | POA: Diagnosis not present

## 2020-07-13 DIAGNOSIS — F0280 Dementia in other diseases classified elsewhere without behavioral disturbance: Secondary | ICD-10-CM | POA: Diagnosis not present

## 2020-07-13 DIAGNOSIS — D649 Anemia, unspecified: Secondary | ICD-10-CM | POA: Diagnosis not present

## 2020-07-13 DIAGNOSIS — G309 Alzheimer's disease, unspecified: Secondary | ICD-10-CM | POA: Diagnosis not present

## 2020-07-13 DIAGNOSIS — E46 Unspecified protein-calorie malnutrition: Secondary | ICD-10-CM | POA: Diagnosis not present

## 2020-07-13 DIAGNOSIS — I10 Essential (primary) hypertension: Secondary | ICD-10-CM | POA: Diagnosis not present

## 2020-07-14 DIAGNOSIS — F0280 Dementia in other diseases classified elsewhere without behavioral disturbance: Secondary | ICD-10-CM | POA: Diagnosis not present

## 2020-07-14 DIAGNOSIS — D649 Anemia, unspecified: Secondary | ICD-10-CM | POA: Diagnosis not present

## 2020-07-14 DIAGNOSIS — E46 Unspecified protein-calorie malnutrition: Secondary | ICD-10-CM | POA: Diagnosis not present

## 2020-07-14 DIAGNOSIS — I10 Essential (primary) hypertension: Secondary | ICD-10-CM | POA: Diagnosis not present

## 2020-07-14 DIAGNOSIS — E785 Hyperlipidemia, unspecified: Secondary | ICD-10-CM | POA: Diagnosis not present

## 2020-07-14 DIAGNOSIS — G309 Alzheimer's disease, unspecified: Secondary | ICD-10-CM | POA: Diagnosis not present

## 2020-07-15 DIAGNOSIS — E46 Unspecified protein-calorie malnutrition: Secondary | ICD-10-CM | POA: Diagnosis not present

## 2020-07-15 DIAGNOSIS — F0280 Dementia in other diseases classified elsewhere without behavioral disturbance: Secondary | ICD-10-CM | POA: Diagnosis not present

## 2020-07-15 DIAGNOSIS — D649 Anemia, unspecified: Secondary | ICD-10-CM | POA: Diagnosis not present

## 2020-07-15 DIAGNOSIS — G309 Alzheimer's disease, unspecified: Secondary | ICD-10-CM | POA: Diagnosis not present

## 2020-07-15 DIAGNOSIS — E785 Hyperlipidemia, unspecified: Secondary | ICD-10-CM | POA: Diagnosis not present

## 2020-07-15 DIAGNOSIS — I10 Essential (primary) hypertension: Secondary | ICD-10-CM | POA: Diagnosis not present

## 2020-07-16 DIAGNOSIS — F0280 Dementia in other diseases classified elsewhere without behavioral disturbance: Secondary | ICD-10-CM | POA: Diagnosis not present

## 2020-07-16 DIAGNOSIS — E46 Unspecified protein-calorie malnutrition: Secondary | ICD-10-CM | POA: Diagnosis not present

## 2020-07-16 DIAGNOSIS — G309 Alzheimer's disease, unspecified: Secondary | ICD-10-CM | POA: Diagnosis not present

## 2020-07-16 DIAGNOSIS — I10 Essential (primary) hypertension: Secondary | ICD-10-CM | POA: Diagnosis not present

## 2020-07-16 DIAGNOSIS — E785 Hyperlipidemia, unspecified: Secondary | ICD-10-CM | POA: Diagnosis not present

## 2020-07-16 DIAGNOSIS — D649 Anemia, unspecified: Secondary | ICD-10-CM | POA: Diagnosis not present

## 2020-07-17 DIAGNOSIS — E46 Unspecified protein-calorie malnutrition: Secondary | ICD-10-CM | POA: Diagnosis not present

## 2020-07-17 DIAGNOSIS — E785 Hyperlipidemia, unspecified: Secondary | ICD-10-CM | POA: Diagnosis not present

## 2020-07-17 DIAGNOSIS — F0280 Dementia in other diseases classified elsewhere without behavioral disturbance: Secondary | ICD-10-CM | POA: Diagnosis not present

## 2020-07-17 DIAGNOSIS — I10 Essential (primary) hypertension: Secondary | ICD-10-CM | POA: Diagnosis not present

## 2020-07-17 DIAGNOSIS — D649 Anemia, unspecified: Secondary | ICD-10-CM | POA: Diagnosis not present

## 2020-07-17 DIAGNOSIS — G309 Alzheimer's disease, unspecified: Secondary | ICD-10-CM | POA: Diagnosis not present

## 2020-07-18 DIAGNOSIS — E46 Unspecified protein-calorie malnutrition: Secondary | ICD-10-CM | POA: Diagnosis not present

## 2020-07-18 DIAGNOSIS — G309 Alzheimer's disease, unspecified: Secondary | ICD-10-CM | POA: Diagnosis not present

## 2020-07-18 DIAGNOSIS — L89220 Pressure ulcer of left hip, unstageable: Secondary | ICD-10-CM | POA: Diagnosis not present

## 2020-07-18 DIAGNOSIS — L89210 Pressure ulcer of right hip, unstageable: Secondary | ICD-10-CM | POA: Diagnosis not present

## 2020-07-18 DIAGNOSIS — E785 Hyperlipidemia, unspecified: Secondary | ICD-10-CM | POA: Diagnosis not present

## 2020-07-18 DIAGNOSIS — D649 Anemia, unspecified: Secondary | ICD-10-CM | POA: Diagnosis not present

## 2020-07-18 DIAGNOSIS — F0280 Dementia in other diseases classified elsewhere without behavioral disturbance: Secondary | ICD-10-CM | POA: Diagnosis not present

## 2020-07-18 DIAGNOSIS — Z681 Body mass index (BMI) 19 or less, adult: Secondary | ICD-10-CM | POA: Diagnosis not present

## 2020-07-18 DIAGNOSIS — I10 Essential (primary) hypertension: Secondary | ICD-10-CM | POA: Diagnosis not present

## 2020-07-19 DIAGNOSIS — F0280 Dementia in other diseases classified elsewhere without behavioral disturbance: Secondary | ICD-10-CM | POA: Diagnosis not present

## 2020-07-19 DIAGNOSIS — D649 Anemia, unspecified: Secondary | ICD-10-CM | POA: Diagnosis not present

## 2020-07-19 DIAGNOSIS — G309 Alzheimer's disease, unspecified: Secondary | ICD-10-CM | POA: Diagnosis not present

## 2020-07-19 DIAGNOSIS — E785 Hyperlipidemia, unspecified: Secondary | ICD-10-CM | POA: Diagnosis not present

## 2020-07-19 DIAGNOSIS — I10 Essential (primary) hypertension: Secondary | ICD-10-CM | POA: Diagnosis not present

## 2020-07-19 DIAGNOSIS — E46 Unspecified protein-calorie malnutrition: Secondary | ICD-10-CM | POA: Diagnosis not present

## 2020-07-20 DIAGNOSIS — E785 Hyperlipidemia, unspecified: Secondary | ICD-10-CM | POA: Diagnosis not present

## 2020-07-20 DIAGNOSIS — G309 Alzheimer's disease, unspecified: Secondary | ICD-10-CM | POA: Diagnosis not present

## 2020-07-20 DIAGNOSIS — F0280 Dementia in other diseases classified elsewhere without behavioral disturbance: Secondary | ICD-10-CM | POA: Diagnosis not present

## 2020-07-20 DIAGNOSIS — E46 Unspecified protein-calorie malnutrition: Secondary | ICD-10-CM | POA: Diagnosis not present

## 2020-07-20 DIAGNOSIS — I10 Essential (primary) hypertension: Secondary | ICD-10-CM | POA: Diagnosis not present

## 2020-07-20 DIAGNOSIS — D649 Anemia, unspecified: Secondary | ICD-10-CM | POA: Diagnosis not present

## 2020-07-21 DIAGNOSIS — E785 Hyperlipidemia, unspecified: Secondary | ICD-10-CM | POA: Diagnosis not present

## 2020-07-21 DIAGNOSIS — E46 Unspecified protein-calorie malnutrition: Secondary | ICD-10-CM | POA: Diagnosis not present

## 2020-07-21 DIAGNOSIS — I10 Essential (primary) hypertension: Secondary | ICD-10-CM | POA: Diagnosis not present

## 2020-07-21 DIAGNOSIS — F0280 Dementia in other diseases classified elsewhere without behavioral disturbance: Secondary | ICD-10-CM | POA: Diagnosis not present

## 2020-07-21 DIAGNOSIS — D649 Anemia, unspecified: Secondary | ICD-10-CM | POA: Diagnosis not present

## 2020-07-21 DIAGNOSIS — G309 Alzheimer's disease, unspecified: Secondary | ICD-10-CM | POA: Diagnosis not present

## 2020-07-22 DIAGNOSIS — G309 Alzheimer's disease, unspecified: Secondary | ICD-10-CM | POA: Diagnosis not present

## 2020-07-22 DIAGNOSIS — D649 Anemia, unspecified: Secondary | ICD-10-CM | POA: Diagnosis not present

## 2020-07-22 DIAGNOSIS — E785 Hyperlipidemia, unspecified: Secondary | ICD-10-CM | POA: Diagnosis not present

## 2020-07-22 DIAGNOSIS — E46 Unspecified protein-calorie malnutrition: Secondary | ICD-10-CM | POA: Diagnosis not present

## 2020-07-22 DIAGNOSIS — F0280 Dementia in other diseases classified elsewhere without behavioral disturbance: Secondary | ICD-10-CM | POA: Diagnosis not present

## 2020-07-22 DIAGNOSIS — I10 Essential (primary) hypertension: Secondary | ICD-10-CM | POA: Diagnosis not present

## 2020-07-25 DIAGNOSIS — E46 Unspecified protein-calorie malnutrition: Secondary | ICD-10-CM | POA: Diagnosis not present

## 2020-07-25 DIAGNOSIS — F0280 Dementia in other diseases classified elsewhere without behavioral disturbance: Secondary | ICD-10-CM | POA: Diagnosis not present

## 2020-07-25 DIAGNOSIS — G309 Alzheimer's disease, unspecified: Secondary | ICD-10-CM | POA: Diagnosis not present

## 2020-07-25 DIAGNOSIS — D649 Anemia, unspecified: Secondary | ICD-10-CM | POA: Diagnosis not present

## 2020-07-25 DIAGNOSIS — E785 Hyperlipidemia, unspecified: Secondary | ICD-10-CM | POA: Diagnosis not present

## 2020-07-25 DIAGNOSIS — I10 Essential (primary) hypertension: Secondary | ICD-10-CM | POA: Diagnosis not present

## 2020-07-26 DIAGNOSIS — F0280 Dementia in other diseases classified elsewhere without behavioral disturbance: Secondary | ICD-10-CM | POA: Diagnosis not present

## 2020-07-26 DIAGNOSIS — G309 Alzheimer's disease, unspecified: Secondary | ICD-10-CM | POA: Diagnosis not present

## 2020-07-26 DIAGNOSIS — I10 Essential (primary) hypertension: Secondary | ICD-10-CM | POA: Diagnosis not present

## 2020-07-26 DIAGNOSIS — E785 Hyperlipidemia, unspecified: Secondary | ICD-10-CM | POA: Diagnosis not present

## 2020-07-26 DIAGNOSIS — D649 Anemia, unspecified: Secondary | ICD-10-CM | POA: Diagnosis not present

## 2020-07-26 DIAGNOSIS — E46 Unspecified protein-calorie malnutrition: Secondary | ICD-10-CM | POA: Diagnosis not present

## 2020-07-27 DIAGNOSIS — G309 Alzheimer's disease, unspecified: Secondary | ICD-10-CM | POA: Diagnosis not present

## 2020-07-27 DIAGNOSIS — D649 Anemia, unspecified: Secondary | ICD-10-CM | POA: Diagnosis not present

## 2020-07-27 DIAGNOSIS — E785 Hyperlipidemia, unspecified: Secondary | ICD-10-CM | POA: Diagnosis not present

## 2020-07-27 DIAGNOSIS — I10 Essential (primary) hypertension: Secondary | ICD-10-CM | POA: Diagnosis not present

## 2020-07-27 DIAGNOSIS — F0280 Dementia in other diseases classified elsewhere without behavioral disturbance: Secondary | ICD-10-CM | POA: Diagnosis not present

## 2020-07-27 DIAGNOSIS — E46 Unspecified protein-calorie malnutrition: Secondary | ICD-10-CM | POA: Diagnosis not present

## 2020-07-28 DIAGNOSIS — I10 Essential (primary) hypertension: Secondary | ICD-10-CM | POA: Diagnosis not present

## 2020-07-28 DIAGNOSIS — F0280 Dementia in other diseases classified elsewhere without behavioral disturbance: Secondary | ICD-10-CM | POA: Diagnosis not present

## 2020-07-28 DIAGNOSIS — E785 Hyperlipidemia, unspecified: Secondary | ICD-10-CM | POA: Diagnosis not present

## 2020-07-28 DIAGNOSIS — G309 Alzheimer's disease, unspecified: Secondary | ICD-10-CM | POA: Diagnosis not present

## 2020-07-28 DIAGNOSIS — D649 Anemia, unspecified: Secondary | ICD-10-CM | POA: Diagnosis not present

## 2020-07-28 DIAGNOSIS — E46 Unspecified protein-calorie malnutrition: Secondary | ICD-10-CM | POA: Diagnosis not present

## 2020-07-29 DIAGNOSIS — D649 Anemia, unspecified: Secondary | ICD-10-CM | POA: Diagnosis not present

## 2020-07-29 DIAGNOSIS — E785 Hyperlipidemia, unspecified: Secondary | ICD-10-CM | POA: Diagnosis not present

## 2020-07-29 DIAGNOSIS — G309 Alzheimer's disease, unspecified: Secondary | ICD-10-CM | POA: Diagnosis not present

## 2020-07-29 DIAGNOSIS — F0280 Dementia in other diseases classified elsewhere without behavioral disturbance: Secondary | ICD-10-CM | POA: Diagnosis not present

## 2020-07-29 DIAGNOSIS — I10 Essential (primary) hypertension: Secondary | ICD-10-CM | POA: Diagnosis not present

## 2020-07-29 DIAGNOSIS — E46 Unspecified protein-calorie malnutrition: Secondary | ICD-10-CM | POA: Diagnosis not present

## 2020-07-30 DIAGNOSIS — E785 Hyperlipidemia, unspecified: Secondary | ICD-10-CM | POA: Diagnosis not present

## 2020-07-30 DIAGNOSIS — I10 Essential (primary) hypertension: Secondary | ICD-10-CM | POA: Diagnosis not present

## 2020-07-30 DIAGNOSIS — G309 Alzheimer's disease, unspecified: Secondary | ICD-10-CM | POA: Diagnosis not present

## 2020-07-30 DIAGNOSIS — E46 Unspecified protein-calorie malnutrition: Secondary | ICD-10-CM | POA: Diagnosis not present

## 2020-07-30 DIAGNOSIS — D649 Anemia, unspecified: Secondary | ICD-10-CM | POA: Diagnosis not present

## 2020-07-30 DIAGNOSIS — F0280 Dementia in other diseases classified elsewhere without behavioral disturbance: Secondary | ICD-10-CM | POA: Diagnosis not present

## 2020-07-31 DIAGNOSIS — E785 Hyperlipidemia, unspecified: Secondary | ICD-10-CM | POA: Diagnosis not present

## 2020-07-31 DIAGNOSIS — F0280 Dementia in other diseases classified elsewhere without behavioral disturbance: Secondary | ICD-10-CM | POA: Diagnosis not present

## 2020-07-31 DIAGNOSIS — I10 Essential (primary) hypertension: Secondary | ICD-10-CM | POA: Diagnosis not present

## 2020-07-31 DIAGNOSIS — G309 Alzheimer's disease, unspecified: Secondary | ICD-10-CM | POA: Diagnosis not present

## 2020-07-31 DIAGNOSIS — D649 Anemia, unspecified: Secondary | ICD-10-CM | POA: Diagnosis not present

## 2020-07-31 DIAGNOSIS — E46 Unspecified protein-calorie malnutrition: Secondary | ICD-10-CM | POA: Diagnosis not present

## 2020-08-01 DIAGNOSIS — E46 Unspecified protein-calorie malnutrition: Secondary | ICD-10-CM | POA: Diagnosis not present

## 2020-08-01 DIAGNOSIS — E785 Hyperlipidemia, unspecified: Secondary | ICD-10-CM | POA: Diagnosis not present

## 2020-08-01 DIAGNOSIS — I10 Essential (primary) hypertension: Secondary | ICD-10-CM | POA: Diagnosis not present

## 2020-08-01 DIAGNOSIS — G309 Alzheimer's disease, unspecified: Secondary | ICD-10-CM | POA: Diagnosis not present

## 2020-08-01 DIAGNOSIS — F0280 Dementia in other diseases classified elsewhere without behavioral disturbance: Secondary | ICD-10-CM | POA: Diagnosis not present

## 2020-08-01 DIAGNOSIS — D649 Anemia, unspecified: Secondary | ICD-10-CM | POA: Diagnosis not present

## 2020-08-02 DIAGNOSIS — I10 Essential (primary) hypertension: Secondary | ICD-10-CM | POA: Diagnosis not present

## 2020-08-02 DIAGNOSIS — G309 Alzheimer's disease, unspecified: Secondary | ICD-10-CM | POA: Diagnosis not present

## 2020-08-02 DIAGNOSIS — E46 Unspecified protein-calorie malnutrition: Secondary | ICD-10-CM | POA: Diagnosis not present

## 2020-08-02 DIAGNOSIS — F0280 Dementia in other diseases classified elsewhere without behavioral disturbance: Secondary | ICD-10-CM | POA: Diagnosis not present

## 2020-08-02 DIAGNOSIS — E785 Hyperlipidemia, unspecified: Secondary | ICD-10-CM | POA: Diagnosis not present

## 2020-08-02 DIAGNOSIS — D649 Anemia, unspecified: Secondary | ICD-10-CM | POA: Diagnosis not present

## 2020-08-03 DIAGNOSIS — D649 Anemia, unspecified: Secondary | ICD-10-CM | POA: Diagnosis not present

## 2020-08-03 DIAGNOSIS — G309 Alzheimer's disease, unspecified: Secondary | ICD-10-CM | POA: Diagnosis not present

## 2020-08-03 DIAGNOSIS — I10 Essential (primary) hypertension: Secondary | ICD-10-CM | POA: Diagnosis not present

## 2020-08-03 DIAGNOSIS — E46 Unspecified protein-calorie malnutrition: Secondary | ICD-10-CM | POA: Diagnosis not present

## 2020-08-03 DIAGNOSIS — F0280 Dementia in other diseases classified elsewhere without behavioral disturbance: Secondary | ICD-10-CM | POA: Diagnosis not present

## 2020-08-03 DIAGNOSIS — E785 Hyperlipidemia, unspecified: Secondary | ICD-10-CM | POA: Diagnosis not present

## 2020-08-04 DIAGNOSIS — F0280 Dementia in other diseases classified elsewhere without behavioral disturbance: Secondary | ICD-10-CM | POA: Diagnosis not present

## 2020-08-04 DIAGNOSIS — E46 Unspecified protein-calorie malnutrition: Secondary | ICD-10-CM | POA: Diagnosis not present

## 2020-08-04 DIAGNOSIS — I10 Essential (primary) hypertension: Secondary | ICD-10-CM | POA: Diagnosis not present

## 2020-08-04 DIAGNOSIS — D649 Anemia, unspecified: Secondary | ICD-10-CM | POA: Diagnosis not present

## 2020-08-04 DIAGNOSIS — E785 Hyperlipidemia, unspecified: Secondary | ICD-10-CM | POA: Diagnosis not present

## 2020-08-04 DIAGNOSIS — G309 Alzheimer's disease, unspecified: Secondary | ICD-10-CM | POA: Diagnosis not present

## 2020-08-05 DIAGNOSIS — G309 Alzheimer's disease, unspecified: Secondary | ICD-10-CM | POA: Diagnosis not present

## 2020-08-05 DIAGNOSIS — E785 Hyperlipidemia, unspecified: Secondary | ICD-10-CM | POA: Diagnosis not present

## 2020-08-05 DIAGNOSIS — D649 Anemia, unspecified: Secondary | ICD-10-CM | POA: Diagnosis not present

## 2020-08-05 DIAGNOSIS — E46 Unspecified protein-calorie malnutrition: Secondary | ICD-10-CM | POA: Diagnosis not present

## 2020-08-05 DIAGNOSIS — I10 Essential (primary) hypertension: Secondary | ICD-10-CM | POA: Diagnosis not present

## 2020-08-05 DIAGNOSIS — F0280 Dementia in other diseases classified elsewhere without behavioral disturbance: Secondary | ICD-10-CM | POA: Diagnosis not present

## 2020-08-07 DIAGNOSIS — D649 Anemia, unspecified: Secondary | ICD-10-CM | POA: Diagnosis not present

## 2020-08-07 DIAGNOSIS — E785 Hyperlipidemia, unspecified: Secondary | ICD-10-CM | POA: Diagnosis not present

## 2020-08-07 DIAGNOSIS — E46 Unspecified protein-calorie malnutrition: Secondary | ICD-10-CM | POA: Diagnosis not present

## 2020-08-07 DIAGNOSIS — F0280 Dementia in other diseases classified elsewhere without behavioral disturbance: Secondary | ICD-10-CM | POA: Diagnosis not present

## 2020-08-07 DIAGNOSIS — G309 Alzheimer's disease, unspecified: Secondary | ICD-10-CM | POA: Diagnosis not present

## 2020-08-07 DIAGNOSIS — I10 Essential (primary) hypertension: Secondary | ICD-10-CM | POA: Diagnosis not present

## 2020-08-08 DIAGNOSIS — E46 Unspecified protein-calorie malnutrition: Secondary | ICD-10-CM | POA: Diagnosis not present

## 2020-08-08 DIAGNOSIS — G309 Alzheimer's disease, unspecified: Secondary | ICD-10-CM | POA: Diagnosis not present

## 2020-08-08 DIAGNOSIS — E785 Hyperlipidemia, unspecified: Secondary | ICD-10-CM | POA: Diagnosis not present

## 2020-08-08 DIAGNOSIS — D649 Anemia, unspecified: Secondary | ICD-10-CM | POA: Diagnosis not present

## 2020-08-08 DIAGNOSIS — F0280 Dementia in other diseases classified elsewhere without behavioral disturbance: Secondary | ICD-10-CM | POA: Diagnosis not present

## 2020-08-08 DIAGNOSIS — I10 Essential (primary) hypertension: Secondary | ICD-10-CM | POA: Diagnosis not present

## 2020-08-09 DIAGNOSIS — E785 Hyperlipidemia, unspecified: Secondary | ICD-10-CM | POA: Diagnosis not present

## 2020-08-09 DIAGNOSIS — I10 Essential (primary) hypertension: Secondary | ICD-10-CM | POA: Diagnosis not present

## 2020-08-09 DIAGNOSIS — E46 Unspecified protein-calorie malnutrition: Secondary | ICD-10-CM | POA: Diagnosis not present

## 2020-08-09 DIAGNOSIS — G309 Alzheimer's disease, unspecified: Secondary | ICD-10-CM | POA: Diagnosis not present

## 2020-08-09 DIAGNOSIS — F0280 Dementia in other diseases classified elsewhere without behavioral disturbance: Secondary | ICD-10-CM | POA: Diagnosis not present

## 2020-08-09 DIAGNOSIS — D649 Anemia, unspecified: Secondary | ICD-10-CM | POA: Diagnosis not present

## 2020-08-10 DIAGNOSIS — E46 Unspecified protein-calorie malnutrition: Secondary | ICD-10-CM | POA: Diagnosis not present

## 2020-08-10 DIAGNOSIS — I10 Essential (primary) hypertension: Secondary | ICD-10-CM | POA: Diagnosis not present

## 2020-08-10 DIAGNOSIS — E785 Hyperlipidemia, unspecified: Secondary | ICD-10-CM | POA: Diagnosis not present

## 2020-08-10 DIAGNOSIS — D649 Anemia, unspecified: Secondary | ICD-10-CM | POA: Diagnosis not present

## 2020-08-10 DIAGNOSIS — G309 Alzheimer's disease, unspecified: Secondary | ICD-10-CM | POA: Diagnosis not present

## 2020-08-10 DIAGNOSIS — F0280 Dementia in other diseases classified elsewhere without behavioral disturbance: Secondary | ICD-10-CM | POA: Diagnosis not present

## 2020-08-12 DIAGNOSIS — E46 Unspecified protein-calorie malnutrition: Secondary | ICD-10-CM | POA: Diagnosis not present

## 2020-08-12 DIAGNOSIS — E785 Hyperlipidemia, unspecified: Secondary | ICD-10-CM | POA: Diagnosis not present

## 2020-08-12 DIAGNOSIS — I10 Essential (primary) hypertension: Secondary | ICD-10-CM | POA: Diagnosis not present

## 2020-08-12 DIAGNOSIS — D649 Anemia, unspecified: Secondary | ICD-10-CM | POA: Diagnosis not present

## 2020-08-12 DIAGNOSIS — F0280 Dementia in other diseases classified elsewhere without behavioral disturbance: Secondary | ICD-10-CM | POA: Diagnosis not present

## 2020-08-12 DIAGNOSIS — G309 Alzheimer's disease, unspecified: Secondary | ICD-10-CM | POA: Diagnosis not present

## 2020-08-14 DIAGNOSIS — E46 Unspecified protein-calorie malnutrition: Secondary | ICD-10-CM | POA: Diagnosis not present

## 2020-08-14 DIAGNOSIS — F0280 Dementia in other diseases classified elsewhere without behavioral disturbance: Secondary | ICD-10-CM | POA: Diagnosis not present

## 2020-08-14 DIAGNOSIS — D649 Anemia, unspecified: Secondary | ICD-10-CM | POA: Diagnosis not present

## 2020-08-14 DIAGNOSIS — E785 Hyperlipidemia, unspecified: Secondary | ICD-10-CM | POA: Diagnosis not present

## 2020-08-14 DIAGNOSIS — I10 Essential (primary) hypertension: Secondary | ICD-10-CM | POA: Diagnosis not present

## 2020-08-14 DIAGNOSIS — G309 Alzheimer's disease, unspecified: Secondary | ICD-10-CM | POA: Diagnosis not present

## 2020-08-15 DIAGNOSIS — I10 Essential (primary) hypertension: Secondary | ICD-10-CM | POA: Diagnosis not present

## 2020-08-15 DIAGNOSIS — G309 Alzheimer's disease, unspecified: Secondary | ICD-10-CM | POA: Diagnosis not present

## 2020-08-15 DIAGNOSIS — E785 Hyperlipidemia, unspecified: Secondary | ICD-10-CM | POA: Diagnosis not present

## 2020-08-15 DIAGNOSIS — E46 Unspecified protein-calorie malnutrition: Secondary | ICD-10-CM | POA: Diagnosis not present

## 2020-08-15 DIAGNOSIS — F0280 Dementia in other diseases classified elsewhere without behavioral disturbance: Secondary | ICD-10-CM | POA: Diagnosis not present

## 2020-08-15 DIAGNOSIS — D649 Anemia, unspecified: Secondary | ICD-10-CM | POA: Diagnosis not present

## 2020-08-16 DIAGNOSIS — E46 Unspecified protein-calorie malnutrition: Secondary | ICD-10-CM | POA: Diagnosis not present

## 2020-08-16 DIAGNOSIS — G309 Alzheimer's disease, unspecified: Secondary | ICD-10-CM | POA: Diagnosis not present

## 2020-08-16 DIAGNOSIS — E785 Hyperlipidemia, unspecified: Secondary | ICD-10-CM | POA: Diagnosis not present

## 2020-08-16 DIAGNOSIS — F0280 Dementia in other diseases classified elsewhere without behavioral disturbance: Secondary | ICD-10-CM | POA: Diagnosis not present

## 2020-08-16 DIAGNOSIS — I10 Essential (primary) hypertension: Secondary | ICD-10-CM | POA: Diagnosis not present

## 2020-08-16 DIAGNOSIS — D649 Anemia, unspecified: Secondary | ICD-10-CM | POA: Diagnosis not present

## 2020-08-17 DIAGNOSIS — G309 Alzheimer's disease, unspecified: Secondary | ICD-10-CM | POA: Diagnosis not present

## 2020-08-17 DIAGNOSIS — E785 Hyperlipidemia, unspecified: Secondary | ICD-10-CM | POA: Diagnosis not present

## 2020-08-17 DIAGNOSIS — Z681 Body mass index (BMI) 19 or less, adult: Secondary | ICD-10-CM | POA: Diagnosis not present

## 2020-08-17 DIAGNOSIS — F0280 Dementia in other diseases classified elsewhere without behavioral disturbance: Secondary | ICD-10-CM | POA: Diagnosis not present

## 2020-08-17 DIAGNOSIS — E46 Unspecified protein-calorie malnutrition: Secondary | ICD-10-CM | POA: Diagnosis not present

## 2020-08-17 DIAGNOSIS — I10 Essential (primary) hypertension: Secondary | ICD-10-CM | POA: Diagnosis not present

## 2020-08-17 DIAGNOSIS — L89210 Pressure ulcer of right hip, unstageable: Secondary | ICD-10-CM | POA: Diagnosis not present

## 2020-08-17 DIAGNOSIS — L89220 Pressure ulcer of left hip, unstageable: Secondary | ICD-10-CM | POA: Diagnosis not present

## 2020-08-17 DIAGNOSIS — D649 Anemia, unspecified: Secondary | ICD-10-CM | POA: Diagnosis not present

## 2020-08-18 DIAGNOSIS — G309 Alzheimer's disease, unspecified: Secondary | ICD-10-CM | POA: Diagnosis not present

## 2020-08-18 DIAGNOSIS — E46 Unspecified protein-calorie malnutrition: Secondary | ICD-10-CM | POA: Diagnosis not present

## 2020-08-18 DIAGNOSIS — I10 Essential (primary) hypertension: Secondary | ICD-10-CM | POA: Diagnosis not present

## 2020-08-18 DIAGNOSIS — F0280 Dementia in other diseases classified elsewhere without behavioral disturbance: Secondary | ICD-10-CM | POA: Diagnosis not present

## 2020-08-18 DIAGNOSIS — E785 Hyperlipidemia, unspecified: Secondary | ICD-10-CM | POA: Diagnosis not present

## 2020-08-18 DIAGNOSIS — D649 Anemia, unspecified: Secondary | ICD-10-CM | POA: Diagnosis not present

## 2020-08-19 DIAGNOSIS — G309 Alzheimer's disease, unspecified: Secondary | ICD-10-CM | POA: Diagnosis not present

## 2020-08-19 DIAGNOSIS — E785 Hyperlipidemia, unspecified: Secondary | ICD-10-CM | POA: Diagnosis not present

## 2020-08-19 DIAGNOSIS — E46 Unspecified protein-calorie malnutrition: Secondary | ICD-10-CM | POA: Diagnosis not present

## 2020-08-19 DIAGNOSIS — F0280 Dementia in other diseases classified elsewhere without behavioral disturbance: Secondary | ICD-10-CM | POA: Diagnosis not present

## 2020-08-19 DIAGNOSIS — I10 Essential (primary) hypertension: Secondary | ICD-10-CM | POA: Diagnosis not present

## 2020-08-19 DIAGNOSIS — D649 Anemia, unspecified: Secondary | ICD-10-CM | POA: Diagnosis not present

## 2020-08-21 DIAGNOSIS — F0280 Dementia in other diseases classified elsewhere without behavioral disturbance: Secondary | ICD-10-CM | POA: Diagnosis not present

## 2020-08-21 DIAGNOSIS — D649 Anemia, unspecified: Secondary | ICD-10-CM | POA: Diagnosis not present

## 2020-08-21 DIAGNOSIS — E785 Hyperlipidemia, unspecified: Secondary | ICD-10-CM | POA: Diagnosis not present

## 2020-08-21 DIAGNOSIS — E46 Unspecified protein-calorie malnutrition: Secondary | ICD-10-CM | POA: Diagnosis not present

## 2020-08-21 DIAGNOSIS — G309 Alzheimer's disease, unspecified: Secondary | ICD-10-CM | POA: Diagnosis not present

## 2020-08-21 DIAGNOSIS — I10 Essential (primary) hypertension: Secondary | ICD-10-CM | POA: Diagnosis not present

## 2020-08-22 DIAGNOSIS — E785 Hyperlipidemia, unspecified: Secondary | ICD-10-CM | POA: Diagnosis not present

## 2020-08-22 DIAGNOSIS — I10 Essential (primary) hypertension: Secondary | ICD-10-CM | POA: Diagnosis not present

## 2020-08-22 DIAGNOSIS — E46 Unspecified protein-calorie malnutrition: Secondary | ICD-10-CM | POA: Diagnosis not present

## 2020-08-22 DIAGNOSIS — G309 Alzheimer's disease, unspecified: Secondary | ICD-10-CM | POA: Diagnosis not present

## 2020-08-22 DIAGNOSIS — D649 Anemia, unspecified: Secondary | ICD-10-CM | POA: Diagnosis not present

## 2020-08-22 DIAGNOSIS — F0280 Dementia in other diseases classified elsewhere without behavioral disturbance: Secondary | ICD-10-CM | POA: Diagnosis not present

## 2020-08-23 DIAGNOSIS — E785 Hyperlipidemia, unspecified: Secondary | ICD-10-CM | POA: Diagnosis not present

## 2020-08-23 DIAGNOSIS — F0280 Dementia in other diseases classified elsewhere without behavioral disturbance: Secondary | ICD-10-CM | POA: Diagnosis not present

## 2020-08-23 DIAGNOSIS — G309 Alzheimer's disease, unspecified: Secondary | ICD-10-CM | POA: Diagnosis not present

## 2020-08-23 DIAGNOSIS — I10 Essential (primary) hypertension: Secondary | ICD-10-CM | POA: Diagnosis not present

## 2020-08-23 DIAGNOSIS — E46 Unspecified protein-calorie malnutrition: Secondary | ICD-10-CM | POA: Diagnosis not present

## 2020-08-23 DIAGNOSIS — D649 Anemia, unspecified: Secondary | ICD-10-CM | POA: Diagnosis not present

## 2020-08-24 DIAGNOSIS — I10 Essential (primary) hypertension: Secondary | ICD-10-CM | POA: Diagnosis not present

## 2020-08-24 DIAGNOSIS — D649 Anemia, unspecified: Secondary | ICD-10-CM | POA: Diagnosis not present

## 2020-08-24 DIAGNOSIS — F0280 Dementia in other diseases classified elsewhere without behavioral disturbance: Secondary | ICD-10-CM | POA: Diagnosis not present

## 2020-08-24 DIAGNOSIS — E785 Hyperlipidemia, unspecified: Secondary | ICD-10-CM | POA: Diagnosis not present

## 2020-08-24 DIAGNOSIS — G309 Alzheimer's disease, unspecified: Secondary | ICD-10-CM | POA: Diagnosis not present

## 2020-08-24 DIAGNOSIS — E46 Unspecified protein-calorie malnutrition: Secondary | ICD-10-CM | POA: Diagnosis not present

## 2020-08-25 DIAGNOSIS — D649 Anemia, unspecified: Secondary | ICD-10-CM | POA: Diagnosis not present

## 2020-08-25 DIAGNOSIS — G309 Alzheimer's disease, unspecified: Secondary | ICD-10-CM | POA: Diagnosis not present

## 2020-08-25 DIAGNOSIS — I10 Essential (primary) hypertension: Secondary | ICD-10-CM | POA: Diagnosis not present

## 2020-08-25 DIAGNOSIS — E785 Hyperlipidemia, unspecified: Secondary | ICD-10-CM | POA: Diagnosis not present

## 2020-08-25 DIAGNOSIS — E46 Unspecified protein-calorie malnutrition: Secondary | ICD-10-CM | POA: Diagnosis not present

## 2020-08-25 DIAGNOSIS — F0280 Dementia in other diseases classified elsewhere without behavioral disturbance: Secondary | ICD-10-CM | POA: Diagnosis not present

## 2020-08-26 ENCOUNTER — Other Ambulatory Visit: Payer: Self-pay | Admitting: Family Medicine

## 2020-08-26 DIAGNOSIS — I10 Essential (primary) hypertension: Secondary | ICD-10-CM | POA: Diagnosis not present

## 2020-08-26 DIAGNOSIS — E785 Hyperlipidemia, unspecified: Secondary | ICD-10-CM | POA: Diagnosis not present

## 2020-08-26 DIAGNOSIS — D649 Anemia, unspecified: Secondary | ICD-10-CM | POA: Diagnosis not present

## 2020-08-26 DIAGNOSIS — E1169 Type 2 diabetes mellitus with other specified complication: Secondary | ICD-10-CM

## 2020-08-26 DIAGNOSIS — G309 Alzheimer's disease, unspecified: Secondary | ICD-10-CM | POA: Diagnosis not present

## 2020-08-26 DIAGNOSIS — F0280 Dementia in other diseases classified elsewhere without behavioral disturbance: Secondary | ICD-10-CM | POA: Diagnosis not present

## 2020-08-26 DIAGNOSIS — E46 Unspecified protein-calorie malnutrition: Secondary | ICD-10-CM | POA: Diagnosis not present

## 2020-08-28 DIAGNOSIS — E785 Hyperlipidemia, unspecified: Secondary | ICD-10-CM | POA: Diagnosis not present

## 2020-08-28 DIAGNOSIS — F0280 Dementia in other diseases classified elsewhere without behavioral disturbance: Secondary | ICD-10-CM | POA: Diagnosis not present

## 2020-08-28 DIAGNOSIS — E46 Unspecified protein-calorie malnutrition: Secondary | ICD-10-CM | POA: Diagnosis not present

## 2020-08-28 DIAGNOSIS — G309 Alzheimer's disease, unspecified: Secondary | ICD-10-CM | POA: Diagnosis not present

## 2020-08-28 DIAGNOSIS — I10 Essential (primary) hypertension: Secondary | ICD-10-CM | POA: Diagnosis not present

## 2020-08-28 DIAGNOSIS — D649 Anemia, unspecified: Secondary | ICD-10-CM | POA: Diagnosis not present

## 2020-08-29 DIAGNOSIS — E785 Hyperlipidemia, unspecified: Secondary | ICD-10-CM | POA: Diagnosis not present

## 2020-08-29 DIAGNOSIS — I10 Essential (primary) hypertension: Secondary | ICD-10-CM | POA: Diagnosis not present

## 2020-08-29 DIAGNOSIS — F0280 Dementia in other diseases classified elsewhere without behavioral disturbance: Secondary | ICD-10-CM | POA: Diagnosis not present

## 2020-08-29 DIAGNOSIS — G309 Alzheimer's disease, unspecified: Secondary | ICD-10-CM | POA: Diagnosis not present

## 2020-08-29 DIAGNOSIS — D649 Anemia, unspecified: Secondary | ICD-10-CM | POA: Diagnosis not present

## 2020-08-29 DIAGNOSIS — E46 Unspecified protein-calorie malnutrition: Secondary | ICD-10-CM | POA: Diagnosis not present

## 2020-08-30 DIAGNOSIS — E785 Hyperlipidemia, unspecified: Secondary | ICD-10-CM | POA: Diagnosis not present

## 2020-08-30 DIAGNOSIS — G309 Alzheimer's disease, unspecified: Secondary | ICD-10-CM | POA: Diagnosis not present

## 2020-08-30 DIAGNOSIS — E46 Unspecified protein-calorie malnutrition: Secondary | ICD-10-CM | POA: Diagnosis not present

## 2020-08-30 DIAGNOSIS — D649 Anemia, unspecified: Secondary | ICD-10-CM | POA: Diagnosis not present

## 2020-08-30 DIAGNOSIS — F0280 Dementia in other diseases classified elsewhere without behavioral disturbance: Secondary | ICD-10-CM | POA: Diagnosis not present

## 2020-08-30 DIAGNOSIS — I10 Essential (primary) hypertension: Secondary | ICD-10-CM | POA: Diagnosis not present

## 2020-08-31 DIAGNOSIS — E46 Unspecified protein-calorie malnutrition: Secondary | ICD-10-CM | POA: Diagnosis not present

## 2020-08-31 DIAGNOSIS — D649 Anemia, unspecified: Secondary | ICD-10-CM | POA: Diagnosis not present

## 2020-08-31 DIAGNOSIS — E785 Hyperlipidemia, unspecified: Secondary | ICD-10-CM | POA: Diagnosis not present

## 2020-08-31 DIAGNOSIS — G309 Alzheimer's disease, unspecified: Secondary | ICD-10-CM | POA: Diagnosis not present

## 2020-08-31 DIAGNOSIS — I10 Essential (primary) hypertension: Secondary | ICD-10-CM | POA: Diagnosis not present

## 2020-08-31 DIAGNOSIS — F0280 Dementia in other diseases classified elsewhere without behavioral disturbance: Secondary | ICD-10-CM | POA: Diagnosis not present

## 2020-09-01 DIAGNOSIS — E46 Unspecified protein-calorie malnutrition: Secondary | ICD-10-CM | POA: Diagnosis not present

## 2020-09-01 DIAGNOSIS — E785 Hyperlipidemia, unspecified: Secondary | ICD-10-CM | POA: Diagnosis not present

## 2020-09-01 DIAGNOSIS — D649 Anemia, unspecified: Secondary | ICD-10-CM | POA: Diagnosis not present

## 2020-09-01 DIAGNOSIS — I10 Essential (primary) hypertension: Secondary | ICD-10-CM | POA: Diagnosis not present

## 2020-09-01 DIAGNOSIS — G309 Alzheimer's disease, unspecified: Secondary | ICD-10-CM | POA: Diagnosis not present

## 2020-09-01 DIAGNOSIS — F0280 Dementia in other diseases classified elsewhere without behavioral disturbance: Secondary | ICD-10-CM | POA: Diagnosis not present

## 2020-09-02 DIAGNOSIS — I10 Essential (primary) hypertension: Secondary | ICD-10-CM | POA: Diagnosis not present

## 2020-09-02 DIAGNOSIS — G309 Alzheimer's disease, unspecified: Secondary | ICD-10-CM | POA: Diagnosis not present

## 2020-09-02 DIAGNOSIS — E46 Unspecified protein-calorie malnutrition: Secondary | ICD-10-CM | POA: Diagnosis not present

## 2020-09-02 DIAGNOSIS — F0280 Dementia in other diseases classified elsewhere without behavioral disturbance: Secondary | ICD-10-CM | POA: Diagnosis not present

## 2020-09-02 DIAGNOSIS — D649 Anemia, unspecified: Secondary | ICD-10-CM | POA: Diagnosis not present

## 2020-09-02 DIAGNOSIS — E785 Hyperlipidemia, unspecified: Secondary | ICD-10-CM | POA: Diagnosis not present

## 2020-09-04 DIAGNOSIS — E785 Hyperlipidemia, unspecified: Secondary | ICD-10-CM | POA: Diagnosis not present

## 2020-09-04 DIAGNOSIS — F0280 Dementia in other diseases classified elsewhere without behavioral disturbance: Secondary | ICD-10-CM | POA: Diagnosis not present

## 2020-09-04 DIAGNOSIS — I10 Essential (primary) hypertension: Secondary | ICD-10-CM | POA: Diagnosis not present

## 2020-09-04 DIAGNOSIS — E46 Unspecified protein-calorie malnutrition: Secondary | ICD-10-CM | POA: Diagnosis not present

## 2020-09-04 DIAGNOSIS — D649 Anemia, unspecified: Secondary | ICD-10-CM | POA: Diagnosis not present

## 2020-09-04 DIAGNOSIS — G309 Alzheimer's disease, unspecified: Secondary | ICD-10-CM | POA: Diagnosis not present

## 2020-09-05 DIAGNOSIS — I10 Essential (primary) hypertension: Secondary | ICD-10-CM | POA: Diagnosis not present

## 2020-09-05 DIAGNOSIS — G309 Alzheimer's disease, unspecified: Secondary | ICD-10-CM | POA: Diagnosis not present

## 2020-09-05 DIAGNOSIS — E785 Hyperlipidemia, unspecified: Secondary | ICD-10-CM | POA: Diagnosis not present

## 2020-09-05 DIAGNOSIS — F0280 Dementia in other diseases classified elsewhere without behavioral disturbance: Secondary | ICD-10-CM | POA: Diagnosis not present

## 2020-09-05 DIAGNOSIS — D649 Anemia, unspecified: Secondary | ICD-10-CM | POA: Diagnosis not present

## 2020-09-05 DIAGNOSIS — E46 Unspecified protein-calorie malnutrition: Secondary | ICD-10-CM | POA: Diagnosis not present

## 2020-09-06 DIAGNOSIS — F0280 Dementia in other diseases classified elsewhere without behavioral disturbance: Secondary | ICD-10-CM | POA: Diagnosis not present

## 2020-09-06 DIAGNOSIS — D649 Anemia, unspecified: Secondary | ICD-10-CM | POA: Diagnosis not present

## 2020-09-06 DIAGNOSIS — I10 Essential (primary) hypertension: Secondary | ICD-10-CM | POA: Diagnosis not present

## 2020-09-06 DIAGNOSIS — G309 Alzheimer's disease, unspecified: Secondary | ICD-10-CM | POA: Diagnosis not present

## 2020-09-06 DIAGNOSIS — E785 Hyperlipidemia, unspecified: Secondary | ICD-10-CM | POA: Diagnosis not present

## 2020-09-06 DIAGNOSIS — E46 Unspecified protein-calorie malnutrition: Secondary | ICD-10-CM | POA: Diagnosis not present

## 2020-09-07 DIAGNOSIS — E46 Unspecified protein-calorie malnutrition: Secondary | ICD-10-CM | POA: Diagnosis not present

## 2020-09-07 DIAGNOSIS — D649 Anemia, unspecified: Secondary | ICD-10-CM | POA: Diagnosis not present

## 2020-09-07 DIAGNOSIS — G309 Alzheimer's disease, unspecified: Secondary | ICD-10-CM | POA: Diagnosis not present

## 2020-09-07 DIAGNOSIS — E785 Hyperlipidemia, unspecified: Secondary | ICD-10-CM | POA: Diagnosis not present

## 2020-09-07 DIAGNOSIS — F0280 Dementia in other diseases classified elsewhere without behavioral disturbance: Secondary | ICD-10-CM | POA: Diagnosis not present

## 2020-09-07 DIAGNOSIS — I10 Essential (primary) hypertension: Secondary | ICD-10-CM | POA: Diagnosis not present

## 2020-09-08 DIAGNOSIS — E46 Unspecified protein-calorie malnutrition: Secondary | ICD-10-CM | POA: Diagnosis not present

## 2020-09-08 DIAGNOSIS — D649 Anemia, unspecified: Secondary | ICD-10-CM | POA: Diagnosis not present

## 2020-09-08 DIAGNOSIS — F0280 Dementia in other diseases classified elsewhere without behavioral disturbance: Secondary | ICD-10-CM | POA: Diagnosis not present

## 2020-09-08 DIAGNOSIS — I10 Essential (primary) hypertension: Secondary | ICD-10-CM | POA: Diagnosis not present

## 2020-09-08 DIAGNOSIS — G309 Alzheimer's disease, unspecified: Secondary | ICD-10-CM | POA: Diagnosis not present

## 2020-09-08 DIAGNOSIS — E785 Hyperlipidemia, unspecified: Secondary | ICD-10-CM | POA: Diagnosis not present

## 2020-09-09 DIAGNOSIS — I10 Essential (primary) hypertension: Secondary | ICD-10-CM | POA: Diagnosis not present

## 2020-09-09 DIAGNOSIS — E785 Hyperlipidemia, unspecified: Secondary | ICD-10-CM | POA: Diagnosis not present

## 2020-09-09 DIAGNOSIS — G309 Alzheimer's disease, unspecified: Secondary | ICD-10-CM | POA: Diagnosis not present

## 2020-09-09 DIAGNOSIS — F0280 Dementia in other diseases classified elsewhere without behavioral disturbance: Secondary | ICD-10-CM | POA: Diagnosis not present

## 2020-09-09 DIAGNOSIS — E46 Unspecified protein-calorie malnutrition: Secondary | ICD-10-CM | POA: Diagnosis not present

## 2020-09-09 DIAGNOSIS — D649 Anemia, unspecified: Secondary | ICD-10-CM | POA: Diagnosis not present

## 2020-09-11 DIAGNOSIS — E46 Unspecified protein-calorie malnutrition: Secondary | ICD-10-CM | POA: Diagnosis not present

## 2020-09-11 DIAGNOSIS — D649 Anemia, unspecified: Secondary | ICD-10-CM | POA: Diagnosis not present

## 2020-09-11 DIAGNOSIS — I10 Essential (primary) hypertension: Secondary | ICD-10-CM | POA: Diagnosis not present

## 2020-09-11 DIAGNOSIS — G309 Alzheimer's disease, unspecified: Secondary | ICD-10-CM | POA: Diagnosis not present

## 2020-09-11 DIAGNOSIS — E785 Hyperlipidemia, unspecified: Secondary | ICD-10-CM | POA: Diagnosis not present

## 2020-09-11 DIAGNOSIS — F0280 Dementia in other diseases classified elsewhere without behavioral disturbance: Secondary | ICD-10-CM | POA: Diagnosis not present

## 2020-09-12 DIAGNOSIS — F0280 Dementia in other diseases classified elsewhere without behavioral disturbance: Secondary | ICD-10-CM | POA: Diagnosis not present

## 2020-09-12 DIAGNOSIS — E785 Hyperlipidemia, unspecified: Secondary | ICD-10-CM | POA: Diagnosis not present

## 2020-09-12 DIAGNOSIS — G309 Alzheimer's disease, unspecified: Secondary | ICD-10-CM | POA: Diagnosis not present

## 2020-09-12 DIAGNOSIS — E46 Unspecified protein-calorie malnutrition: Secondary | ICD-10-CM | POA: Diagnosis not present

## 2020-09-12 DIAGNOSIS — I10 Essential (primary) hypertension: Secondary | ICD-10-CM | POA: Diagnosis not present

## 2020-09-12 DIAGNOSIS — D649 Anemia, unspecified: Secondary | ICD-10-CM | POA: Diagnosis not present

## 2020-09-13 DIAGNOSIS — E785 Hyperlipidemia, unspecified: Secondary | ICD-10-CM | POA: Diagnosis not present

## 2020-09-13 DIAGNOSIS — F0280 Dementia in other diseases classified elsewhere without behavioral disturbance: Secondary | ICD-10-CM | POA: Diagnosis not present

## 2020-09-13 DIAGNOSIS — D649 Anemia, unspecified: Secondary | ICD-10-CM | POA: Diagnosis not present

## 2020-09-13 DIAGNOSIS — G309 Alzheimer's disease, unspecified: Secondary | ICD-10-CM | POA: Diagnosis not present

## 2020-09-13 DIAGNOSIS — E46 Unspecified protein-calorie malnutrition: Secondary | ICD-10-CM | POA: Diagnosis not present

## 2020-09-13 DIAGNOSIS — I10 Essential (primary) hypertension: Secondary | ICD-10-CM | POA: Diagnosis not present

## 2020-09-14 DIAGNOSIS — I10 Essential (primary) hypertension: Secondary | ICD-10-CM | POA: Diagnosis not present

## 2020-09-14 DIAGNOSIS — E785 Hyperlipidemia, unspecified: Secondary | ICD-10-CM | POA: Diagnosis not present

## 2020-09-14 DIAGNOSIS — E46 Unspecified protein-calorie malnutrition: Secondary | ICD-10-CM | POA: Diagnosis not present

## 2020-09-14 DIAGNOSIS — G309 Alzheimer's disease, unspecified: Secondary | ICD-10-CM | POA: Diagnosis not present

## 2020-09-14 DIAGNOSIS — F0280 Dementia in other diseases classified elsewhere without behavioral disturbance: Secondary | ICD-10-CM | POA: Diagnosis not present

## 2020-09-14 DIAGNOSIS — D649 Anemia, unspecified: Secondary | ICD-10-CM | POA: Diagnosis not present

## 2020-09-15 DIAGNOSIS — E785 Hyperlipidemia, unspecified: Secondary | ICD-10-CM | POA: Diagnosis not present

## 2020-09-15 DIAGNOSIS — G309 Alzheimer's disease, unspecified: Secondary | ICD-10-CM | POA: Diagnosis not present

## 2020-09-15 DIAGNOSIS — E46 Unspecified protein-calorie malnutrition: Secondary | ICD-10-CM | POA: Diagnosis not present

## 2020-09-15 DIAGNOSIS — F0280 Dementia in other diseases classified elsewhere without behavioral disturbance: Secondary | ICD-10-CM | POA: Diagnosis not present

## 2020-09-15 DIAGNOSIS — I10 Essential (primary) hypertension: Secondary | ICD-10-CM | POA: Diagnosis not present

## 2020-09-15 DIAGNOSIS — D649 Anemia, unspecified: Secondary | ICD-10-CM | POA: Diagnosis not present

## 2020-09-16 DIAGNOSIS — E785 Hyperlipidemia, unspecified: Secondary | ICD-10-CM | POA: Diagnosis not present

## 2020-09-16 DIAGNOSIS — I10 Essential (primary) hypertension: Secondary | ICD-10-CM | POA: Diagnosis not present

## 2020-09-16 DIAGNOSIS — G309 Alzheimer's disease, unspecified: Secondary | ICD-10-CM | POA: Diagnosis not present

## 2020-09-16 DIAGNOSIS — E46 Unspecified protein-calorie malnutrition: Secondary | ICD-10-CM | POA: Diagnosis not present

## 2020-09-16 DIAGNOSIS — D649 Anemia, unspecified: Secondary | ICD-10-CM | POA: Diagnosis not present

## 2020-09-16 DIAGNOSIS — F0280 Dementia in other diseases classified elsewhere without behavioral disturbance: Secondary | ICD-10-CM | POA: Diagnosis not present

## 2020-09-17 DIAGNOSIS — L89210 Pressure ulcer of right hip, unstageable: Secondary | ICD-10-CM | POA: Diagnosis not present

## 2020-09-17 DIAGNOSIS — L89220 Pressure ulcer of left hip, unstageable: Secondary | ICD-10-CM | POA: Diagnosis not present

## 2020-09-17 DIAGNOSIS — G309 Alzheimer's disease, unspecified: Secondary | ICD-10-CM | POA: Diagnosis not present

## 2020-09-17 DIAGNOSIS — F0280 Dementia in other diseases classified elsewhere without behavioral disturbance: Secondary | ICD-10-CM | POA: Diagnosis not present

## 2020-09-17 DIAGNOSIS — D649 Anemia, unspecified: Secondary | ICD-10-CM | POA: Diagnosis not present

## 2020-09-17 DIAGNOSIS — E785 Hyperlipidemia, unspecified: Secondary | ICD-10-CM | POA: Diagnosis not present

## 2020-09-17 DIAGNOSIS — I10 Essential (primary) hypertension: Secondary | ICD-10-CM | POA: Diagnosis not present

## 2020-09-17 DIAGNOSIS — E46 Unspecified protein-calorie malnutrition: Secondary | ICD-10-CM | POA: Diagnosis not present

## 2020-09-17 DIAGNOSIS — Z681 Body mass index (BMI) 19 or less, adult: Secondary | ICD-10-CM | POA: Diagnosis not present

## 2020-09-18 DIAGNOSIS — F0280 Dementia in other diseases classified elsewhere without behavioral disturbance: Secondary | ICD-10-CM | POA: Diagnosis not present

## 2020-09-18 DIAGNOSIS — E785 Hyperlipidemia, unspecified: Secondary | ICD-10-CM | POA: Diagnosis not present

## 2020-09-18 DIAGNOSIS — G309 Alzheimer's disease, unspecified: Secondary | ICD-10-CM | POA: Diagnosis not present

## 2020-09-18 DIAGNOSIS — D649 Anemia, unspecified: Secondary | ICD-10-CM | POA: Diagnosis not present

## 2020-09-18 DIAGNOSIS — E46 Unspecified protein-calorie malnutrition: Secondary | ICD-10-CM | POA: Diagnosis not present

## 2020-09-18 DIAGNOSIS — I10 Essential (primary) hypertension: Secondary | ICD-10-CM | POA: Diagnosis not present

## 2020-09-19 DIAGNOSIS — I10 Essential (primary) hypertension: Secondary | ICD-10-CM | POA: Diagnosis not present

## 2020-09-19 DIAGNOSIS — G309 Alzheimer's disease, unspecified: Secondary | ICD-10-CM | POA: Diagnosis not present

## 2020-09-19 DIAGNOSIS — E46 Unspecified protein-calorie malnutrition: Secondary | ICD-10-CM | POA: Diagnosis not present

## 2020-09-19 DIAGNOSIS — E785 Hyperlipidemia, unspecified: Secondary | ICD-10-CM | POA: Diagnosis not present

## 2020-09-19 DIAGNOSIS — D649 Anemia, unspecified: Secondary | ICD-10-CM | POA: Diagnosis not present

## 2020-09-19 DIAGNOSIS — F0280 Dementia in other diseases classified elsewhere without behavioral disturbance: Secondary | ICD-10-CM | POA: Diagnosis not present

## 2020-09-20 DIAGNOSIS — E785 Hyperlipidemia, unspecified: Secondary | ICD-10-CM | POA: Diagnosis not present

## 2020-09-20 DIAGNOSIS — E46 Unspecified protein-calorie malnutrition: Secondary | ICD-10-CM | POA: Diagnosis not present

## 2020-09-20 DIAGNOSIS — D649 Anemia, unspecified: Secondary | ICD-10-CM | POA: Diagnosis not present

## 2020-09-20 DIAGNOSIS — F0280 Dementia in other diseases classified elsewhere without behavioral disturbance: Secondary | ICD-10-CM | POA: Diagnosis not present

## 2020-09-20 DIAGNOSIS — G309 Alzheimer's disease, unspecified: Secondary | ICD-10-CM | POA: Diagnosis not present

## 2020-09-20 DIAGNOSIS — I10 Essential (primary) hypertension: Secondary | ICD-10-CM | POA: Diagnosis not present

## 2020-09-21 DIAGNOSIS — E46 Unspecified protein-calorie malnutrition: Secondary | ICD-10-CM | POA: Diagnosis not present

## 2020-09-21 DIAGNOSIS — E785 Hyperlipidemia, unspecified: Secondary | ICD-10-CM | POA: Diagnosis not present

## 2020-09-21 DIAGNOSIS — F0280 Dementia in other diseases classified elsewhere without behavioral disturbance: Secondary | ICD-10-CM | POA: Diagnosis not present

## 2020-09-21 DIAGNOSIS — D649 Anemia, unspecified: Secondary | ICD-10-CM | POA: Diagnosis not present

## 2020-09-21 DIAGNOSIS — I10 Essential (primary) hypertension: Secondary | ICD-10-CM | POA: Diagnosis not present

## 2020-09-21 DIAGNOSIS — G309 Alzheimer's disease, unspecified: Secondary | ICD-10-CM | POA: Diagnosis not present

## 2020-09-22 DIAGNOSIS — E785 Hyperlipidemia, unspecified: Secondary | ICD-10-CM | POA: Diagnosis not present

## 2020-09-22 DIAGNOSIS — G309 Alzheimer's disease, unspecified: Secondary | ICD-10-CM | POA: Diagnosis not present

## 2020-09-22 DIAGNOSIS — E46 Unspecified protein-calorie malnutrition: Secondary | ICD-10-CM | POA: Diagnosis not present

## 2020-09-22 DIAGNOSIS — D649 Anemia, unspecified: Secondary | ICD-10-CM | POA: Diagnosis not present

## 2020-09-22 DIAGNOSIS — I10 Essential (primary) hypertension: Secondary | ICD-10-CM | POA: Diagnosis not present

## 2020-09-22 DIAGNOSIS — F0280 Dementia in other diseases classified elsewhere without behavioral disturbance: Secondary | ICD-10-CM | POA: Diagnosis not present

## 2020-09-23 DIAGNOSIS — D649 Anemia, unspecified: Secondary | ICD-10-CM | POA: Diagnosis not present

## 2020-09-23 DIAGNOSIS — G309 Alzheimer's disease, unspecified: Secondary | ICD-10-CM | POA: Diagnosis not present

## 2020-09-23 DIAGNOSIS — E785 Hyperlipidemia, unspecified: Secondary | ICD-10-CM | POA: Diagnosis not present

## 2020-09-23 DIAGNOSIS — I10 Essential (primary) hypertension: Secondary | ICD-10-CM | POA: Diagnosis not present

## 2020-09-23 DIAGNOSIS — E46 Unspecified protein-calorie malnutrition: Secondary | ICD-10-CM | POA: Diagnosis not present

## 2020-09-23 DIAGNOSIS — F0280 Dementia in other diseases classified elsewhere without behavioral disturbance: Secondary | ICD-10-CM | POA: Diagnosis not present

## 2020-09-25 DIAGNOSIS — E785 Hyperlipidemia, unspecified: Secondary | ICD-10-CM | POA: Diagnosis not present

## 2020-09-25 DIAGNOSIS — G309 Alzheimer's disease, unspecified: Secondary | ICD-10-CM | POA: Diagnosis not present

## 2020-09-25 DIAGNOSIS — F0280 Dementia in other diseases classified elsewhere without behavioral disturbance: Secondary | ICD-10-CM | POA: Diagnosis not present

## 2020-09-25 DIAGNOSIS — D649 Anemia, unspecified: Secondary | ICD-10-CM | POA: Diagnosis not present

## 2020-09-25 DIAGNOSIS — E46 Unspecified protein-calorie malnutrition: Secondary | ICD-10-CM | POA: Diagnosis not present

## 2020-09-25 DIAGNOSIS — I10 Essential (primary) hypertension: Secondary | ICD-10-CM | POA: Diagnosis not present

## 2020-09-26 DIAGNOSIS — I10 Essential (primary) hypertension: Secondary | ICD-10-CM | POA: Diagnosis not present

## 2020-09-26 DIAGNOSIS — F0280 Dementia in other diseases classified elsewhere without behavioral disturbance: Secondary | ICD-10-CM | POA: Diagnosis not present

## 2020-09-26 DIAGNOSIS — E785 Hyperlipidemia, unspecified: Secondary | ICD-10-CM | POA: Diagnosis not present

## 2020-09-26 DIAGNOSIS — E46 Unspecified protein-calorie malnutrition: Secondary | ICD-10-CM | POA: Diagnosis not present

## 2020-09-26 DIAGNOSIS — D649 Anemia, unspecified: Secondary | ICD-10-CM | POA: Diagnosis not present

## 2020-09-26 DIAGNOSIS — G309 Alzheimer's disease, unspecified: Secondary | ICD-10-CM | POA: Diagnosis not present

## 2020-09-27 DIAGNOSIS — E785 Hyperlipidemia, unspecified: Secondary | ICD-10-CM | POA: Diagnosis not present

## 2020-09-27 DIAGNOSIS — D649 Anemia, unspecified: Secondary | ICD-10-CM | POA: Diagnosis not present

## 2020-09-27 DIAGNOSIS — G309 Alzheimer's disease, unspecified: Secondary | ICD-10-CM | POA: Diagnosis not present

## 2020-09-27 DIAGNOSIS — I10 Essential (primary) hypertension: Secondary | ICD-10-CM | POA: Diagnosis not present

## 2020-09-27 DIAGNOSIS — F0280 Dementia in other diseases classified elsewhere without behavioral disturbance: Secondary | ICD-10-CM | POA: Diagnosis not present

## 2020-09-27 DIAGNOSIS — E46 Unspecified protein-calorie malnutrition: Secondary | ICD-10-CM | POA: Diagnosis not present

## 2020-09-28 DIAGNOSIS — E785 Hyperlipidemia, unspecified: Secondary | ICD-10-CM | POA: Diagnosis not present

## 2020-09-28 DIAGNOSIS — E46 Unspecified protein-calorie malnutrition: Secondary | ICD-10-CM | POA: Diagnosis not present

## 2020-09-28 DIAGNOSIS — F0280 Dementia in other diseases classified elsewhere without behavioral disturbance: Secondary | ICD-10-CM | POA: Diagnosis not present

## 2020-09-28 DIAGNOSIS — I10 Essential (primary) hypertension: Secondary | ICD-10-CM | POA: Diagnosis not present

## 2020-09-28 DIAGNOSIS — D649 Anemia, unspecified: Secondary | ICD-10-CM | POA: Diagnosis not present

## 2020-09-28 DIAGNOSIS — G309 Alzheimer's disease, unspecified: Secondary | ICD-10-CM | POA: Diagnosis not present

## 2020-09-29 DIAGNOSIS — I10 Essential (primary) hypertension: Secondary | ICD-10-CM | POA: Diagnosis not present

## 2020-09-29 DIAGNOSIS — E785 Hyperlipidemia, unspecified: Secondary | ICD-10-CM | POA: Diagnosis not present

## 2020-09-29 DIAGNOSIS — G309 Alzheimer's disease, unspecified: Secondary | ICD-10-CM | POA: Diagnosis not present

## 2020-09-29 DIAGNOSIS — F0280 Dementia in other diseases classified elsewhere without behavioral disturbance: Secondary | ICD-10-CM | POA: Diagnosis not present

## 2020-09-29 DIAGNOSIS — E46 Unspecified protein-calorie malnutrition: Secondary | ICD-10-CM | POA: Diagnosis not present

## 2020-09-29 DIAGNOSIS — D649 Anemia, unspecified: Secondary | ICD-10-CM | POA: Diagnosis not present

## 2020-09-30 DIAGNOSIS — E46 Unspecified protein-calorie malnutrition: Secondary | ICD-10-CM | POA: Diagnosis not present

## 2020-09-30 DIAGNOSIS — I10 Essential (primary) hypertension: Secondary | ICD-10-CM | POA: Diagnosis not present

## 2020-09-30 DIAGNOSIS — F0280 Dementia in other diseases classified elsewhere without behavioral disturbance: Secondary | ICD-10-CM | POA: Diagnosis not present

## 2020-09-30 DIAGNOSIS — G309 Alzheimer's disease, unspecified: Secondary | ICD-10-CM | POA: Diagnosis not present

## 2020-09-30 DIAGNOSIS — D649 Anemia, unspecified: Secondary | ICD-10-CM | POA: Diagnosis not present

## 2020-09-30 DIAGNOSIS — E785 Hyperlipidemia, unspecified: Secondary | ICD-10-CM | POA: Diagnosis not present

## 2020-10-03 DIAGNOSIS — I10 Essential (primary) hypertension: Secondary | ICD-10-CM | POA: Diagnosis not present

## 2020-10-03 DIAGNOSIS — E785 Hyperlipidemia, unspecified: Secondary | ICD-10-CM | POA: Diagnosis not present

## 2020-10-03 DIAGNOSIS — G309 Alzheimer's disease, unspecified: Secondary | ICD-10-CM | POA: Diagnosis not present

## 2020-10-03 DIAGNOSIS — E46 Unspecified protein-calorie malnutrition: Secondary | ICD-10-CM | POA: Diagnosis not present

## 2020-10-03 DIAGNOSIS — D649 Anemia, unspecified: Secondary | ICD-10-CM | POA: Diagnosis not present

## 2020-10-03 DIAGNOSIS — F0280 Dementia in other diseases classified elsewhere without behavioral disturbance: Secondary | ICD-10-CM | POA: Diagnosis not present

## 2020-10-04 DIAGNOSIS — E46 Unspecified protein-calorie malnutrition: Secondary | ICD-10-CM | POA: Diagnosis not present

## 2020-10-04 DIAGNOSIS — E785 Hyperlipidemia, unspecified: Secondary | ICD-10-CM | POA: Diagnosis not present

## 2020-10-04 DIAGNOSIS — D649 Anemia, unspecified: Secondary | ICD-10-CM | POA: Diagnosis not present

## 2020-10-04 DIAGNOSIS — I10 Essential (primary) hypertension: Secondary | ICD-10-CM | POA: Diagnosis not present

## 2020-10-04 DIAGNOSIS — G309 Alzheimer's disease, unspecified: Secondary | ICD-10-CM | POA: Diagnosis not present

## 2020-10-04 DIAGNOSIS — F0280 Dementia in other diseases classified elsewhere without behavioral disturbance: Secondary | ICD-10-CM | POA: Diagnosis not present

## 2020-10-05 DIAGNOSIS — G309 Alzheimer's disease, unspecified: Secondary | ICD-10-CM | POA: Diagnosis not present

## 2020-10-05 DIAGNOSIS — E785 Hyperlipidemia, unspecified: Secondary | ICD-10-CM | POA: Diagnosis not present

## 2020-10-05 DIAGNOSIS — F0280 Dementia in other diseases classified elsewhere without behavioral disturbance: Secondary | ICD-10-CM | POA: Diagnosis not present

## 2020-10-05 DIAGNOSIS — E46 Unspecified protein-calorie malnutrition: Secondary | ICD-10-CM | POA: Diagnosis not present

## 2020-10-05 DIAGNOSIS — I10 Essential (primary) hypertension: Secondary | ICD-10-CM | POA: Diagnosis not present

## 2020-10-05 DIAGNOSIS — D649 Anemia, unspecified: Secondary | ICD-10-CM | POA: Diagnosis not present

## 2020-10-06 DIAGNOSIS — I10 Essential (primary) hypertension: Secondary | ICD-10-CM | POA: Diagnosis not present

## 2020-10-06 DIAGNOSIS — F0280 Dementia in other diseases classified elsewhere without behavioral disturbance: Secondary | ICD-10-CM | POA: Diagnosis not present

## 2020-10-06 DIAGNOSIS — G309 Alzheimer's disease, unspecified: Secondary | ICD-10-CM | POA: Diagnosis not present

## 2020-10-06 DIAGNOSIS — E46 Unspecified protein-calorie malnutrition: Secondary | ICD-10-CM | POA: Diagnosis not present

## 2020-10-06 DIAGNOSIS — D649 Anemia, unspecified: Secondary | ICD-10-CM | POA: Diagnosis not present

## 2020-10-06 DIAGNOSIS — E785 Hyperlipidemia, unspecified: Secondary | ICD-10-CM | POA: Diagnosis not present

## 2020-10-07 DIAGNOSIS — D649 Anemia, unspecified: Secondary | ICD-10-CM | POA: Diagnosis not present

## 2020-10-07 DIAGNOSIS — G309 Alzheimer's disease, unspecified: Secondary | ICD-10-CM | POA: Diagnosis not present

## 2020-10-07 DIAGNOSIS — E785 Hyperlipidemia, unspecified: Secondary | ICD-10-CM | POA: Diagnosis not present

## 2020-10-07 DIAGNOSIS — I10 Essential (primary) hypertension: Secondary | ICD-10-CM | POA: Diagnosis not present

## 2020-10-07 DIAGNOSIS — E46 Unspecified protein-calorie malnutrition: Secondary | ICD-10-CM | POA: Diagnosis not present

## 2020-10-07 DIAGNOSIS — F0280 Dementia in other diseases classified elsewhere without behavioral disturbance: Secondary | ICD-10-CM | POA: Diagnosis not present

## 2020-10-09 DIAGNOSIS — E46 Unspecified protein-calorie malnutrition: Secondary | ICD-10-CM | POA: Diagnosis not present

## 2020-10-09 DIAGNOSIS — F0280 Dementia in other diseases classified elsewhere without behavioral disturbance: Secondary | ICD-10-CM | POA: Diagnosis not present

## 2020-10-09 DIAGNOSIS — D649 Anemia, unspecified: Secondary | ICD-10-CM | POA: Diagnosis not present

## 2020-10-09 DIAGNOSIS — I10 Essential (primary) hypertension: Secondary | ICD-10-CM | POA: Diagnosis not present

## 2020-10-09 DIAGNOSIS — E785 Hyperlipidemia, unspecified: Secondary | ICD-10-CM | POA: Diagnosis not present

## 2020-10-09 DIAGNOSIS — G309 Alzheimer's disease, unspecified: Secondary | ICD-10-CM | POA: Diagnosis not present

## 2020-10-10 DIAGNOSIS — F0280 Dementia in other diseases classified elsewhere without behavioral disturbance: Secondary | ICD-10-CM | POA: Diagnosis not present

## 2020-10-10 DIAGNOSIS — E46 Unspecified protein-calorie malnutrition: Secondary | ICD-10-CM | POA: Diagnosis not present

## 2020-10-10 DIAGNOSIS — D649 Anemia, unspecified: Secondary | ICD-10-CM | POA: Diagnosis not present

## 2020-10-10 DIAGNOSIS — G309 Alzheimer's disease, unspecified: Secondary | ICD-10-CM | POA: Diagnosis not present

## 2020-10-10 DIAGNOSIS — I10 Essential (primary) hypertension: Secondary | ICD-10-CM | POA: Diagnosis not present

## 2020-10-10 DIAGNOSIS — E785 Hyperlipidemia, unspecified: Secondary | ICD-10-CM | POA: Diagnosis not present

## 2020-10-11 DIAGNOSIS — I10 Essential (primary) hypertension: Secondary | ICD-10-CM | POA: Diagnosis not present

## 2020-10-11 DIAGNOSIS — F0280 Dementia in other diseases classified elsewhere without behavioral disturbance: Secondary | ICD-10-CM | POA: Diagnosis not present

## 2020-10-11 DIAGNOSIS — E46 Unspecified protein-calorie malnutrition: Secondary | ICD-10-CM | POA: Diagnosis not present

## 2020-10-11 DIAGNOSIS — E785 Hyperlipidemia, unspecified: Secondary | ICD-10-CM | POA: Diagnosis not present

## 2020-10-11 DIAGNOSIS — G309 Alzheimer's disease, unspecified: Secondary | ICD-10-CM | POA: Diagnosis not present

## 2020-10-11 DIAGNOSIS — D649 Anemia, unspecified: Secondary | ICD-10-CM | POA: Diagnosis not present

## 2020-10-12 DIAGNOSIS — I10 Essential (primary) hypertension: Secondary | ICD-10-CM | POA: Diagnosis not present

## 2020-10-12 DIAGNOSIS — D649 Anemia, unspecified: Secondary | ICD-10-CM | POA: Diagnosis not present

## 2020-10-12 DIAGNOSIS — F0280 Dementia in other diseases classified elsewhere without behavioral disturbance: Secondary | ICD-10-CM | POA: Diagnosis not present

## 2020-10-12 DIAGNOSIS — G309 Alzheimer's disease, unspecified: Secondary | ICD-10-CM | POA: Diagnosis not present

## 2020-10-12 DIAGNOSIS — E46 Unspecified protein-calorie malnutrition: Secondary | ICD-10-CM | POA: Diagnosis not present

## 2020-10-12 DIAGNOSIS — E785 Hyperlipidemia, unspecified: Secondary | ICD-10-CM | POA: Diagnosis not present

## 2020-10-13 DIAGNOSIS — F0280 Dementia in other diseases classified elsewhere without behavioral disturbance: Secondary | ICD-10-CM | POA: Diagnosis not present

## 2020-10-13 DIAGNOSIS — E46 Unspecified protein-calorie malnutrition: Secondary | ICD-10-CM | POA: Diagnosis not present

## 2020-10-13 DIAGNOSIS — G309 Alzheimer's disease, unspecified: Secondary | ICD-10-CM | POA: Diagnosis not present

## 2020-10-13 DIAGNOSIS — D649 Anemia, unspecified: Secondary | ICD-10-CM | POA: Diagnosis not present

## 2020-10-13 DIAGNOSIS — I10 Essential (primary) hypertension: Secondary | ICD-10-CM | POA: Diagnosis not present

## 2020-10-13 DIAGNOSIS — E785 Hyperlipidemia, unspecified: Secondary | ICD-10-CM | POA: Diagnosis not present

## 2020-10-14 DIAGNOSIS — G309 Alzheimer's disease, unspecified: Secondary | ICD-10-CM | POA: Diagnosis not present

## 2020-10-14 DIAGNOSIS — I10 Essential (primary) hypertension: Secondary | ICD-10-CM | POA: Diagnosis not present

## 2020-10-14 DIAGNOSIS — E785 Hyperlipidemia, unspecified: Secondary | ICD-10-CM | POA: Diagnosis not present

## 2020-10-14 DIAGNOSIS — E46 Unspecified protein-calorie malnutrition: Secondary | ICD-10-CM | POA: Diagnosis not present

## 2020-10-14 DIAGNOSIS — F0280 Dementia in other diseases classified elsewhere without behavioral disturbance: Secondary | ICD-10-CM | POA: Diagnosis not present

## 2020-10-14 DIAGNOSIS — D649 Anemia, unspecified: Secondary | ICD-10-CM | POA: Diagnosis not present

## 2020-10-16 DIAGNOSIS — I10 Essential (primary) hypertension: Secondary | ICD-10-CM | POA: Diagnosis not present

## 2020-10-16 DIAGNOSIS — E785 Hyperlipidemia, unspecified: Secondary | ICD-10-CM | POA: Diagnosis not present

## 2020-10-16 DIAGNOSIS — D649 Anemia, unspecified: Secondary | ICD-10-CM | POA: Diagnosis not present

## 2020-10-16 DIAGNOSIS — F0280 Dementia in other diseases classified elsewhere without behavioral disturbance: Secondary | ICD-10-CM | POA: Diagnosis not present

## 2020-10-16 DIAGNOSIS — E46 Unspecified protein-calorie malnutrition: Secondary | ICD-10-CM | POA: Diagnosis not present

## 2020-10-16 DIAGNOSIS — G309 Alzheimer's disease, unspecified: Secondary | ICD-10-CM | POA: Diagnosis not present

## 2020-10-17 DIAGNOSIS — G309 Alzheimer's disease, unspecified: Secondary | ICD-10-CM | POA: Diagnosis not present

## 2020-10-17 DIAGNOSIS — I10 Essential (primary) hypertension: Secondary | ICD-10-CM | POA: Diagnosis not present

## 2020-10-17 DIAGNOSIS — E46 Unspecified protein-calorie malnutrition: Secondary | ICD-10-CM | POA: Diagnosis not present

## 2020-10-17 DIAGNOSIS — D649 Anemia, unspecified: Secondary | ICD-10-CM | POA: Diagnosis not present

## 2020-10-17 DIAGNOSIS — E785 Hyperlipidemia, unspecified: Secondary | ICD-10-CM | POA: Diagnosis not present

## 2020-10-17 DIAGNOSIS — F0280 Dementia in other diseases classified elsewhere without behavioral disturbance: Secondary | ICD-10-CM | POA: Diagnosis not present

## 2020-10-18 DIAGNOSIS — M24542 Contracture, left hand: Secondary | ICD-10-CM | POA: Diagnosis not present

## 2020-10-18 DIAGNOSIS — G309 Alzheimer's disease, unspecified: Secondary | ICD-10-CM | POA: Diagnosis not present

## 2020-10-18 DIAGNOSIS — L89213 Pressure ulcer of right hip, stage 3: Secondary | ICD-10-CM | POA: Diagnosis not present

## 2020-10-18 DIAGNOSIS — L89152 Pressure ulcer of sacral region, stage 2: Secondary | ICD-10-CM | POA: Diagnosis not present

## 2020-10-18 DIAGNOSIS — D649 Anemia, unspecified: Secondary | ICD-10-CM | POA: Diagnosis not present

## 2020-10-18 DIAGNOSIS — I10 Essential (primary) hypertension: Secondary | ICD-10-CM | POA: Diagnosis not present

## 2020-10-18 DIAGNOSIS — Z681 Body mass index (BMI) 19 or less, adult: Secondary | ICD-10-CM | POA: Diagnosis not present

## 2020-10-18 DIAGNOSIS — M21379 Foot drop, unspecified foot: Secondary | ICD-10-CM | POA: Diagnosis not present

## 2020-10-18 DIAGNOSIS — R32 Unspecified urinary incontinence: Secondary | ICD-10-CM | POA: Diagnosis not present

## 2020-10-18 DIAGNOSIS — F0280 Dementia in other diseases classified elsewhere without behavioral disturbance: Secondary | ICD-10-CM | POA: Diagnosis not present

## 2020-10-18 DIAGNOSIS — L89223 Pressure ulcer of left hip, stage 3: Secondary | ICD-10-CM | POA: Diagnosis not present

## 2020-10-18 DIAGNOSIS — E46 Unspecified protein-calorie malnutrition: Secondary | ICD-10-CM | POA: Diagnosis not present

## 2020-10-18 DIAGNOSIS — E785 Hyperlipidemia, unspecified: Secondary | ICD-10-CM | POA: Diagnosis not present

## 2020-10-18 DIAGNOSIS — Z7401 Bed confinement status: Secondary | ICD-10-CM | POA: Diagnosis not present

## 2020-10-19 DIAGNOSIS — E46 Unspecified protein-calorie malnutrition: Secondary | ICD-10-CM | POA: Diagnosis not present

## 2020-10-19 DIAGNOSIS — F0280 Dementia in other diseases classified elsewhere without behavioral disturbance: Secondary | ICD-10-CM | POA: Diagnosis not present

## 2020-10-19 DIAGNOSIS — I10 Essential (primary) hypertension: Secondary | ICD-10-CM | POA: Diagnosis not present

## 2020-10-19 DIAGNOSIS — G309 Alzheimer's disease, unspecified: Secondary | ICD-10-CM | POA: Diagnosis not present

## 2020-10-19 DIAGNOSIS — D649 Anemia, unspecified: Secondary | ICD-10-CM | POA: Diagnosis not present

## 2020-10-19 DIAGNOSIS — E785 Hyperlipidemia, unspecified: Secondary | ICD-10-CM | POA: Diagnosis not present

## 2020-10-20 DIAGNOSIS — G309 Alzheimer's disease, unspecified: Secondary | ICD-10-CM | POA: Diagnosis not present

## 2020-10-20 DIAGNOSIS — E46 Unspecified protein-calorie malnutrition: Secondary | ICD-10-CM | POA: Diagnosis not present

## 2020-10-20 DIAGNOSIS — D649 Anemia, unspecified: Secondary | ICD-10-CM | POA: Diagnosis not present

## 2020-10-20 DIAGNOSIS — I10 Essential (primary) hypertension: Secondary | ICD-10-CM | POA: Diagnosis not present

## 2020-10-20 DIAGNOSIS — E785 Hyperlipidemia, unspecified: Secondary | ICD-10-CM | POA: Diagnosis not present

## 2020-10-20 DIAGNOSIS — F0280 Dementia in other diseases classified elsewhere without behavioral disturbance: Secondary | ICD-10-CM | POA: Diagnosis not present

## 2020-10-21 DIAGNOSIS — I10 Essential (primary) hypertension: Secondary | ICD-10-CM | POA: Diagnosis not present

## 2020-10-21 DIAGNOSIS — D649 Anemia, unspecified: Secondary | ICD-10-CM | POA: Diagnosis not present

## 2020-10-21 DIAGNOSIS — F0280 Dementia in other diseases classified elsewhere without behavioral disturbance: Secondary | ICD-10-CM | POA: Diagnosis not present

## 2020-10-21 DIAGNOSIS — G309 Alzheimer's disease, unspecified: Secondary | ICD-10-CM | POA: Diagnosis not present

## 2020-10-21 DIAGNOSIS — E785 Hyperlipidemia, unspecified: Secondary | ICD-10-CM | POA: Diagnosis not present

## 2020-10-21 DIAGNOSIS — E46 Unspecified protein-calorie malnutrition: Secondary | ICD-10-CM | POA: Diagnosis not present

## 2020-10-23 DIAGNOSIS — D649 Anemia, unspecified: Secondary | ICD-10-CM | POA: Diagnosis not present

## 2020-10-23 DIAGNOSIS — E785 Hyperlipidemia, unspecified: Secondary | ICD-10-CM | POA: Diagnosis not present

## 2020-10-23 DIAGNOSIS — I10 Essential (primary) hypertension: Secondary | ICD-10-CM | POA: Diagnosis not present

## 2020-10-23 DIAGNOSIS — F0280 Dementia in other diseases classified elsewhere without behavioral disturbance: Secondary | ICD-10-CM | POA: Diagnosis not present

## 2020-10-23 DIAGNOSIS — G309 Alzheimer's disease, unspecified: Secondary | ICD-10-CM | POA: Diagnosis not present

## 2020-10-23 DIAGNOSIS — E46 Unspecified protein-calorie malnutrition: Secondary | ICD-10-CM | POA: Diagnosis not present

## 2020-10-24 DIAGNOSIS — D649 Anemia, unspecified: Secondary | ICD-10-CM | POA: Diagnosis not present

## 2020-10-24 DIAGNOSIS — G309 Alzheimer's disease, unspecified: Secondary | ICD-10-CM | POA: Diagnosis not present

## 2020-10-24 DIAGNOSIS — I10 Essential (primary) hypertension: Secondary | ICD-10-CM | POA: Diagnosis not present

## 2020-10-24 DIAGNOSIS — E46 Unspecified protein-calorie malnutrition: Secondary | ICD-10-CM | POA: Diagnosis not present

## 2020-10-24 DIAGNOSIS — E785 Hyperlipidemia, unspecified: Secondary | ICD-10-CM | POA: Diagnosis not present

## 2020-10-24 DIAGNOSIS — F0280 Dementia in other diseases classified elsewhere without behavioral disturbance: Secondary | ICD-10-CM | POA: Diagnosis not present

## 2020-10-25 DIAGNOSIS — E46 Unspecified protein-calorie malnutrition: Secondary | ICD-10-CM | POA: Diagnosis not present

## 2020-10-25 DIAGNOSIS — F0280 Dementia in other diseases classified elsewhere without behavioral disturbance: Secondary | ICD-10-CM | POA: Diagnosis not present

## 2020-10-25 DIAGNOSIS — I10 Essential (primary) hypertension: Secondary | ICD-10-CM | POA: Diagnosis not present

## 2020-10-25 DIAGNOSIS — D649 Anemia, unspecified: Secondary | ICD-10-CM | POA: Diagnosis not present

## 2020-10-25 DIAGNOSIS — E785 Hyperlipidemia, unspecified: Secondary | ICD-10-CM | POA: Diagnosis not present

## 2020-10-25 DIAGNOSIS — G309 Alzheimer's disease, unspecified: Secondary | ICD-10-CM | POA: Diagnosis not present

## 2020-10-26 DIAGNOSIS — E46 Unspecified protein-calorie malnutrition: Secondary | ICD-10-CM | POA: Diagnosis not present

## 2020-10-26 DIAGNOSIS — G309 Alzheimer's disease, unspecified: Secondary | ICD-10-CM | POA: Diagnosis not present

## 2020-10-26 DIAGNOSIS — F0280 Dementia in other diseases classified elsewhere without behavioral disturbance: Secondary | ICD-10-CM | POA: Diagnosis not present

## 2020-10-26 DIAGNOSIS — D649 Anemia, unspecified: Secondary | ICD-10-CM | POA: Diagnosis not present

## 2020-10-26 DIAGNOSIS — I10 Essential (primary) hypertension: Secondary | ICD-10-CM | POA: Diagnosis not present

## 2020-10-26 DIAGNOSIS — E785 Hyperlipidemia, unspecified: Secondary | ICD-10-CM | POA: Diagnosis not present

## 2020-10-27 DIAGNOSIS — D649 Anemia, unspecified: Secondary | ICD-10-CM | POA: Diagnosis not present

## 2020-10-27 DIAGNOSIS — E785 Hyperlipidemia, unspecified: Secondary | ICD-10-CM | POA: Diagnosis not present

## 2020-10-27 DIAGNOSIS — F0280 Dementia in other diseases classified elsewhere without behavioral disturbance: Secondary | ICD-10-CM | POA: Diagnosis not present

## 2020-10-27 DIAGNOSIS — E46 Unspecified protein-calorie malnutrition: Secondary | ICD-10-CM | POA: Diagnosis not present

## 2020-10-27 DIAGNOSIS — G309 Alzheimer's disease, unspecified: Secondary | ICD-10-CM | POA: Diagnosis not present

## 2020-10-27 DIAGNOSIS — I10 Essential (primary) hypertension: Secondary | ICD-10-CM | POA: Diagnosis not present

## 2020-10-28 DIAGNOSIS — G309 Alzheimer's disease, unspecified: Secondary | ICD-10-CM | POA: Diagnosis not present

## 2020-10-28 DIAGNOSIS — I10 Essential (primary) hypertension: Secondary | ICD-10-CM | POA: Diagnosis not present

## 2020-10-28 DIAGNOSIS — F0280 Dementia in other diseases classified elsewhere without behavioral disturbance: Secondary | ICD-10-CM | POA: Diagnosis not present

## 2020-10-28 DIAGNOSIS — D649 Anemia, unspecified: Secondary | ICD-10-CM | POA: Diagnosis not present

## 2020-10-28 DIAGNOSIS — E785 Hyperlipidemia, unspecified: Secondary | ICD-10-CM | POA: Diagnosis not present

## 2020-10-28 DIAGNOSIS — E46 Unspecified protein-calorie malnutrition: Secondary | ICD-10-CM | POA: Diagnosis not present

## 2020-10-30 DIAGNOSIS — E785 Hyperlipidemia, unspecified: Secondary | ICD-10-CM | POA: Diagnosis not present

## 2020-10-30 DIAGNOSIS — I10 Essential (primary) hypertension: Secondary | ICD-10-CM | POA: Diagnosis not present

## 2020-10-30 DIAGNOSIS — F0280 Dementia in other diseases classified elsewhere without behavioral disturbance: Secondary | ICD-10-CM | POA: Diagnosis not present

## 2020-10-30 DIAGNOSIS — D649 Anemia, unspecified: Secondary | ICD-10-CM | POA: Diagnosis not present

## 2020-10-30 DIAGNOSIS — G309 Alzheimer's disease, unspecified: Secondary | ICD-10-CM | POA: Diagnosis not present

## 2020-10-30 DIAGNOSIS — E46 Unspecified protein-calorie malnutrition: Secondary | ICD-10-CM | POA: Diagnosis not present

## 2020-10-31 DIAGNOSIS — E46 Unspecified protein-calorie malnutrition: Secondary | ICD-10-CM | POA: Diagnosis not present

## 2020-10-31 DIAGNOSIS — F0280 Dementia in other diseases classified elsewhere without behavioral disturbance: Secondary | ICD-10-CM | POA: Diagnosis not present

## 2020-10-31 DIAGNOSIS — I10 Essential (primary) hypertension: Secondary | ICD-10-CM | POA: Diagnosis not present

## 2020-10-31 DIAGNOSIS — E785 Hyperlipidemia, unspecified: Secondary | ICD-10-CM | POA: Diagnosis not present

## 2020-10-31 DIAGNOSIS — G309 Alzheimer's disease, unspecified: Secondary | ICD-10-CM | POA: Diagnosis not present

## 2020-10-31 DIAGNOSIS — D649 Anemia, unspecified: Secondary | ICD-10-CM | POA: Diagnosis not present

## 2020-11-01 DIAGNOSIS — E785 Hyperlipidemia, unspecified: Secondary | ICD-10-CM | POA: Diagnosis not present

## 2020-11-01 DIAGNOSIS — F0280 Dementia in other diseases classified elsewhere without behavioral disturbance: Secondary | ICD-10-CM | POA: Diagnosis not present

## 2020-11-01 DIAGNOSIS — I10 Essential (primary) hypertension: Secondary | ICD-10-CM | POA: Diagnosis not present

## 2020-11-01 DIAGNOSIS — E46 Unspecified protein-calorie malnutrition: Secondary | ICD-10-CM | POA: Diagnosis not present

## 2020-11-01 DIAGNOSIS — D649 Anemia, unspecified: Secondary | ICD-10-CM | POA: Diagnosis not present

## 2020-11-01 DIAGNOSIS — G309 Alzheimer's disease, unspecified: Secondary | ICD-10-CM | POA: Diagnosis not present

## 2020-11-02 DIAGNOSIS — F0280 Dementia in other diseases classified elsewhere without behavioral disturbance: Secondary | ICD-10-CM | POA: Diagnosis not present

## 2020-11-02 DIAGNOSIS — D649 Anemia, unspecified: Secondary | ICD-10-CM | POA: Diagnosis not present

## 2020-11-02 DIAGNOSIS — I10 Essential (primary) hypertension: Secondary | ICD-10-CM | POA: Diagnosis not present

## 2020-11-02 DIAGNOSIS — E785 Hyperlipidemia, unspecified: Secondary | ICD-10-CM | POA: Diagnosis not present

## 2020-11-02 DIAGNOSIS — G309 Alzheimer's disease, unspecified: Secondary | ICD-10-CM | POA: Diagnosis not present

## 2020-11-02 DIAGNOSIS — E46 Unspecified protein-calorie malnutrition: Secondary | ICD-10-CM | POA: Diagnosis not present

## 2020-11-03 DIAGNOSIS — E46 Unspecified protein-calorie malnutrition: Secondary | ICD-10-CM | POA: Diagnosis not present

## 2020-11-03 DIAGNOSIS — D649 Anemia, unspecified: Secondary | ICD-10-CM | POA: Diagnosis not present

## 2020-11-03 DIAGNOSIS — F0280 Dementia in other diseases classified elsewhere without behavioral disturbance: Secondary | ICD-10-CM | POA: Diagnosis not present

## 2020-11-03 DIAGNOSIS — I10 Essential (primary) hypertension: Secondary | ICD-10-CM | POA: Diagnosis not present

## 2020-11-03 DIAGNOSIS — G309 Alzheimer's disease, unspecified: Secondary | ICD-10-CM | POA: Diagnosis not present

## 2020-11-03 DIAGNOSIS — E785 Hyperlipidemia, unspecified: Secondary | ICD-10-CM | POA: Diagnosis not present

## 2020-11-03 IMAGING — CT CT CERVICAL SPINE W/O CM
2 series · 12 of 27 positions shown, 15 images · non-contrast
Comparison: Head CT dated 05/07/2018.

CLINICAL DATA: 86-year-old female with fall.

EXAM:
CT HEAD WITHOUT CONTRAST
CT CERVICAL SPINE WITHOUT CONTRAST
TECHNIQUE: Multidetector CT imaging of the head and cervical spine was
performed following the standard protocol without intravenous
contrast. Multiplanar CT image reconstructions of the cervical spine
were also generated.

[Series 4: c spine soft · axial · 0.43mm/px · z∈[+1472,+1592]mm · 7 of 72 slices shown, 9 images]
[im 6/72  soft-tissue]
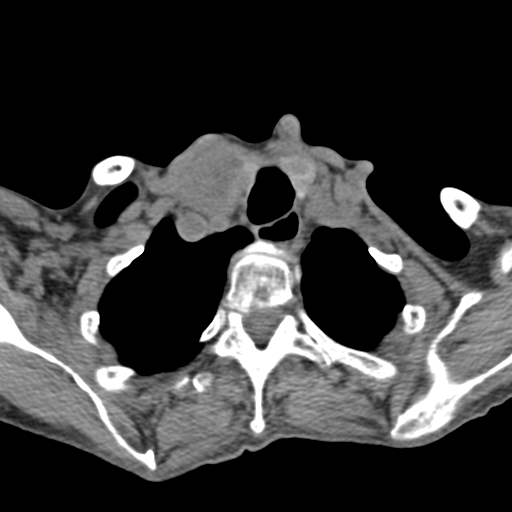
[im 6/72  bone]
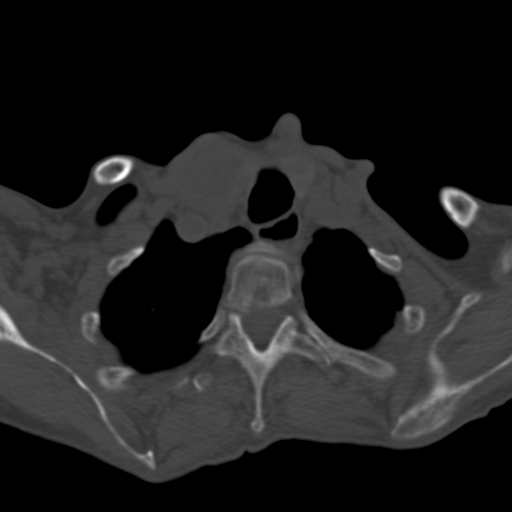
[im 17/72  bone]
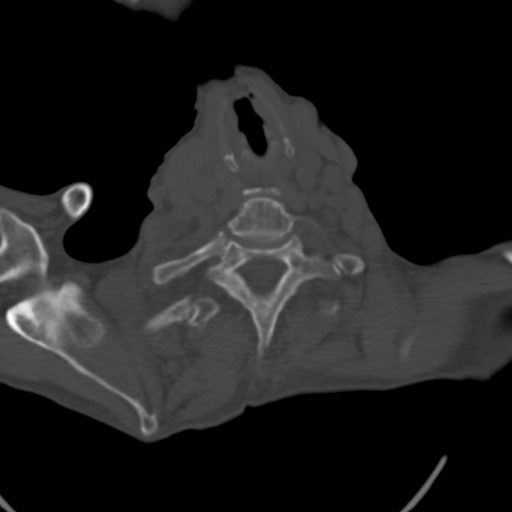
[im 28/72  bone]
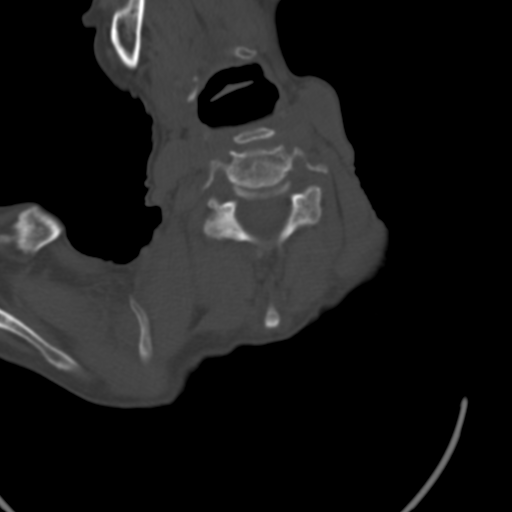
[im 39/72  bone]
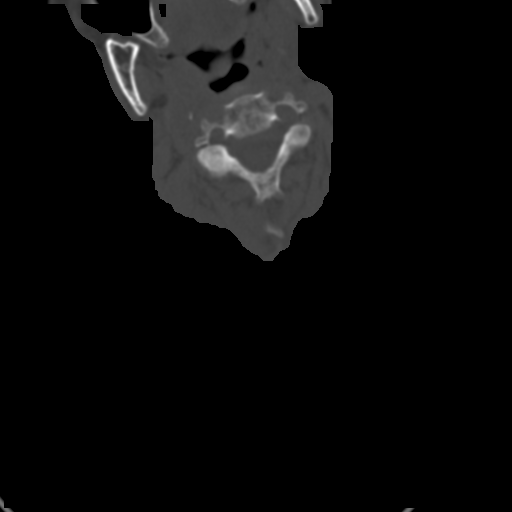
[im 44/72  soft-tissue]
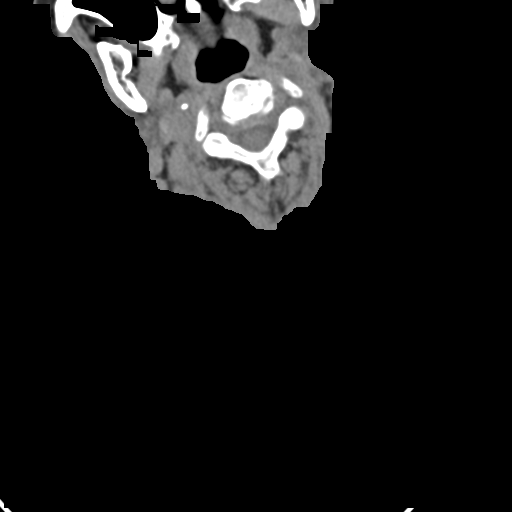
[im 44/72  bone]
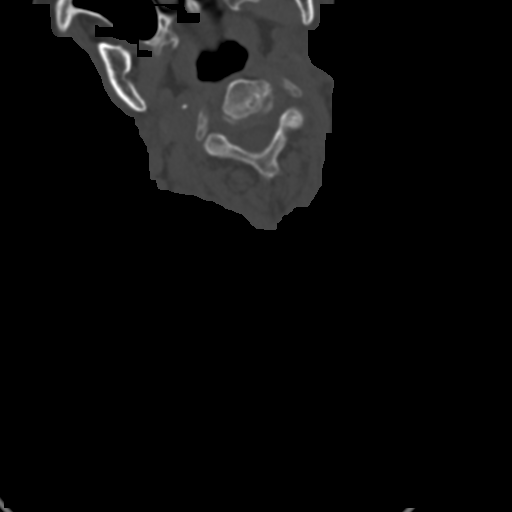
[im 55/72  bone]
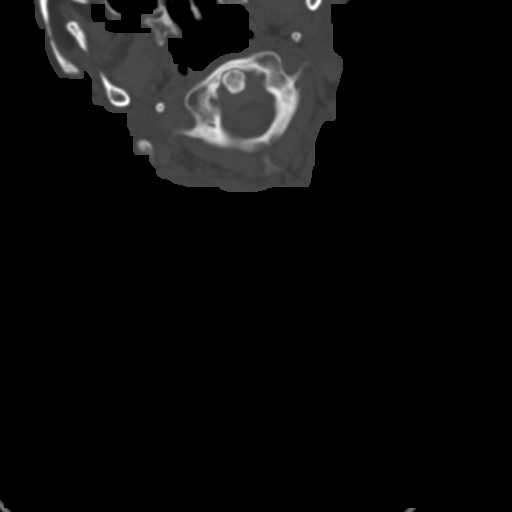
[im 66/72  bone]
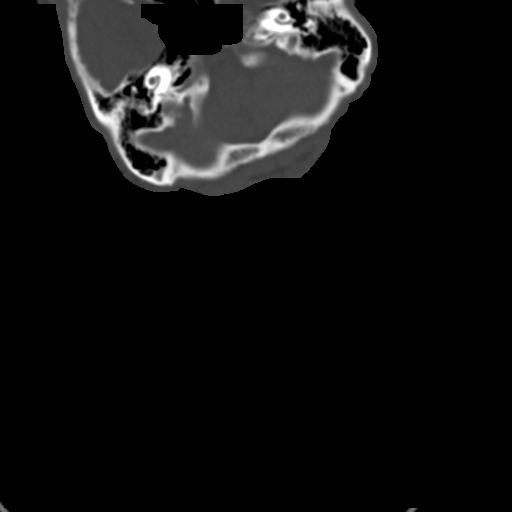

[Series 7: sagittal bone · sagittal · 0.26mm/px · 5 of 54 slices shown, 6 images]
[im 18/54  bone]
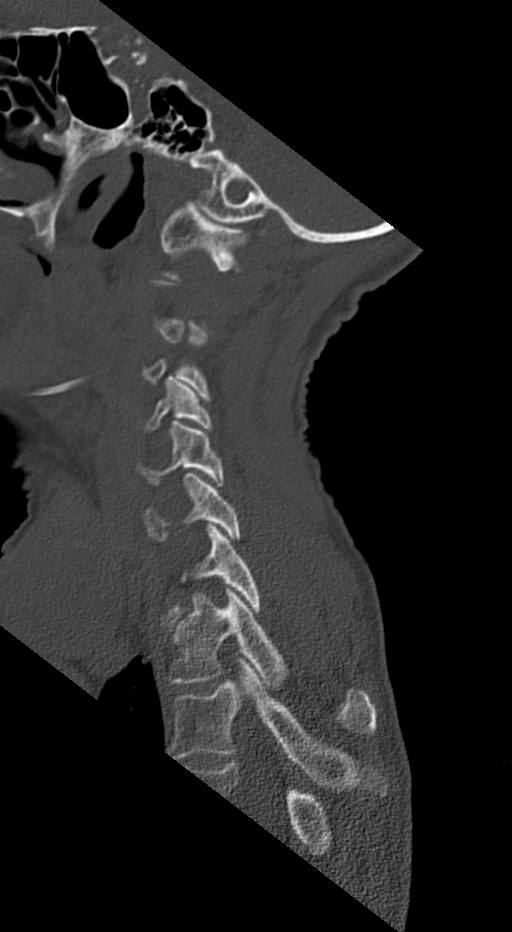
[im 23/54  bone]
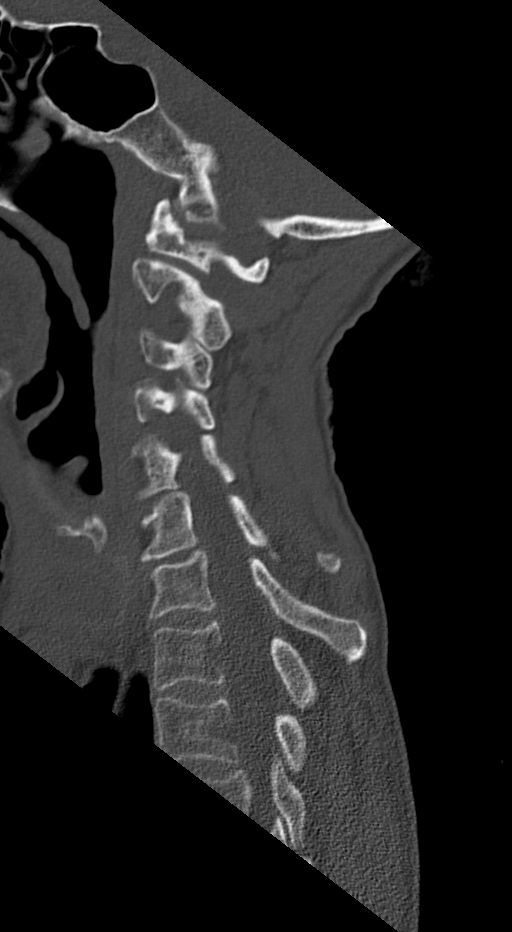
[im 27/54  soft-tissue]
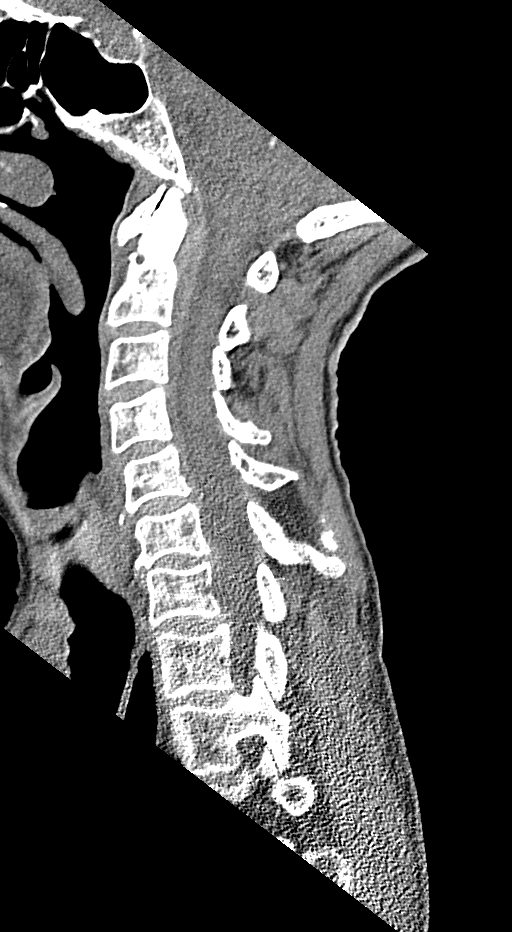
[im 27/54  bone]
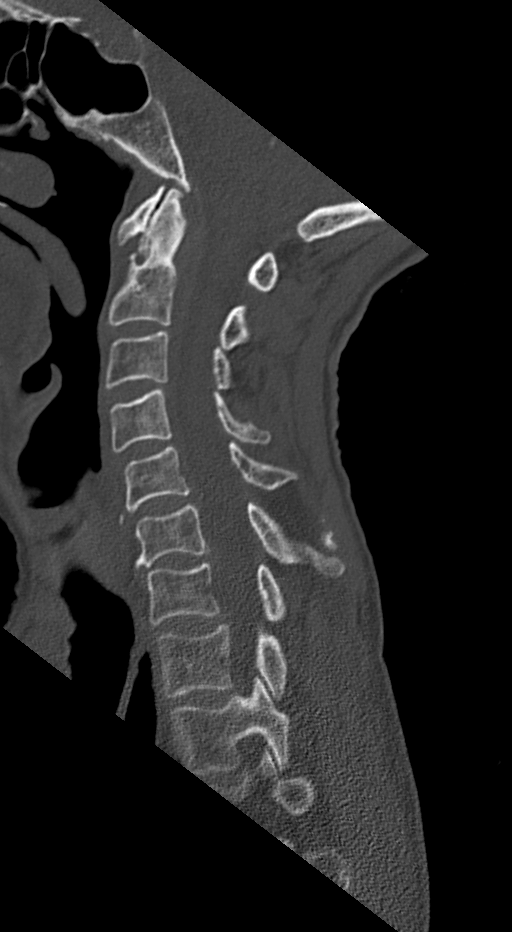
[im 31/54  bone]
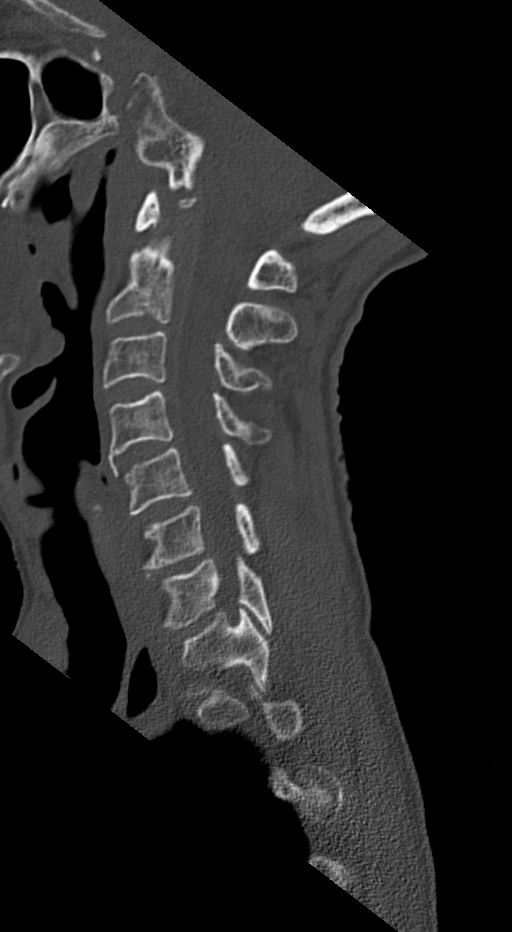
[im 36/54  bone]
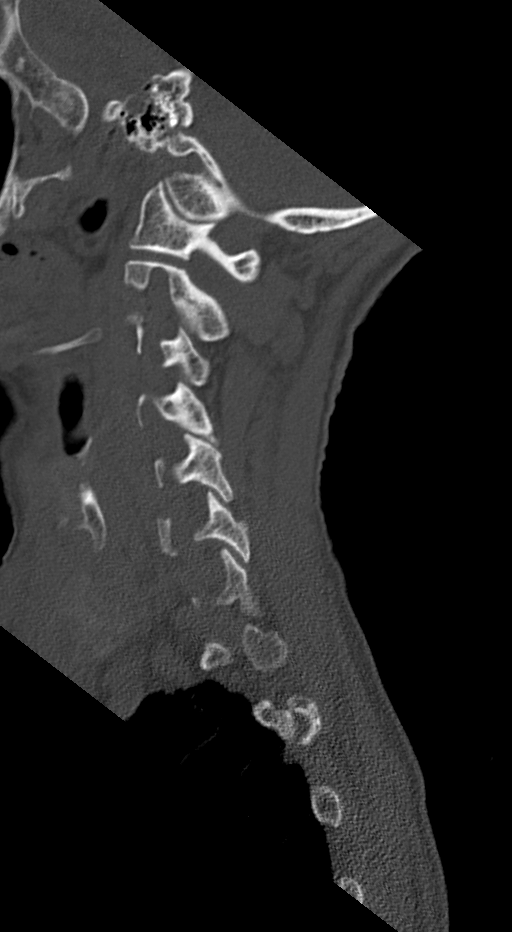

[12 of 27 positions shown; findings below may reference images not displayed]

FINDINGS: CT HEAD FINDINGS

Brain: Mild age-related atrophy and chronic microvascular ischemic
changes. There is no acute intracranial hemorrhage. No mass effect
or midline shift. No extra-axial fluid collection.

Vascular: No hyperdense vessel or unexpected calcification.

Skull: Normal. Negative for fracture or focal lesion.

Sinuses/Orbits: No acute finding.

Other: None

CT CERVICAL SPINE FINDINGS

Alignment: No acute subluxation.

Skull base and vertebrae: No acute fracture

Soft tissues and spinal canal: No prevertebral fluid or swelling. No
visible canal hematoma.

Disc levels:  No acute findings. Mild degenerative changes

Upper chest: Large bilateral thyroid hypodense nodules. The right
thyroid nodule measures 3 cm. Recommend thyroid US (ref: [HOSPITAL]. [DATE]): 143-50).

Other: Bilateral carotid bulb calcified plaques.
IMPRESSION: 1. No acute intracranial pathology. Mild age-related atrophy and
chronic microvascular ischemic changes.
2. No acute/traumatic cervical spine pathology.
3. Large bilateral thyroid hypodense nodules. Recommend thyroid US
(ref: [HOSPITAL].

## 2020-11-04 DIAGNOSIS — F0280 Dementia in other diseases classified elsewhere without behavioral disturbance: Secondary | ICD-10-CM | POA: Diagnosis not present

## 2020-11-04 DIAGNOSIS — E785 Hyperlipidemia, unspecified: Secondary | ICD-10-CM | POA: Diagnosis not present

## 2020-11-04 DIAGNOSIS — E46 Unspecified protein-calorie malnutrition: Secondary | ICD-10-CM | POA: Diagnosis not present

## 2020-11-04 DIAGNOSIS — G309 Alzheimer's disease, unspecified: Secondary | ICD-10-CM | POA: Diagnosis not present

## 2020-11-04 DIAGNOSIS — I10 Essential (primary) hypertension: Secondary | ICD-10-CM | POA: Diagnosis not present

## 2020-11-04 DIAGNOSIS — D649 Anemia, unspecified: Secondary | ICD-10-CM | POA: Diagnosis not present

## 2020-11-06 DIAGNOSIS — F0280 Dementia in other diseases classified elsewhere without behavioral disturbance: Secondary | ICD-10-CM | POA: Diagnosis not present

## 2020-11-06 DIAGNOSIS — I10 Essential (primary) hypertension: Secondary | ICD-10-CM | POA: Diagnosis not present

## 2020-11-06 DIAGNOSIS — E785 Hyperlipidemia, unspecified: Secondary | ICD-10-CM | POA: Diagnosis not present

## 2020-11-06 DIAGNOSIS — D649 Anemia, unspecified: Secondary | ICD-10-CM | POA: Diagnosis not present

## 2020-11-06 DIAGNOSIS — E46 Unspecified protein-calorie malnutrition: Secondary | ICD-10-CM | POA: Diagnosis not present

## 2020-11-06 DIAGNOSIS — G309 Alzheimer's disease, unspecified: Secondary | ICD-10-CM | POA: Diagnosis not present

## 2020-11-07 DIAGNOSIS — D649 Anemia, unspecified: Secondary | ICD-10-CM | POA: Diagnosis not present

## 2020-11-07 DIAGNOSIS — G309 Alzheimer's disease, unspecified: Secondary | ICD-10-CM | POA: Diagnosis not present

## 2020-11-07 DIAGNOSIS — I10 Essential (primary) hypertension: Secondary | ICD-10-CM | POA: Diagnosis not present

## 2020-11-07 DIAGNOSIS — E785 Hyperlipidemia, unspecified: Secondary | ICD-10-CM | POA: Diagnosis not present

## 2020-11-07 DIAGNOSIS — F0280 Dementia in other diseases classified elsewhere without behavioral disturbance: Secondary | ICD-10-CM | POA: Diagnosis not present

## 2020-11-07 DIAGNOSIS — E46 Unspecified protein-calorie malnutrition: Secondary | ICD-10-CM | POA: Diagnosis not present

## 2020-11-08 DIAGNOSIS — F0280 Dementia in other diseases classified elsewhere without behavioral disturbance: Secondary | ICD-10-CM | POA: Diagnosis not present

## 2020-11-08 DIAGNOSIS — E785 Hyperlipidemia, unspecified: Secondary | ICD-10-CM | POA: Diagnosis not present

## 2020-11-08 DIAGNOSIS — G309 Alzheimer's disease, unspecified: Secondary | ICD-10-CM | POA: Diagnosis not present

## 2020-11-08 DIAGNOSIS — I10 Essential (primary) hypertension: Secondary | ICD-10-CM | POA: Diagnosis not present

## 2020-11-08 DIAGNOSIS — E46 Unspecified protein-calorie malnutrition: Secondary | ICD-10-CM | POA: Diagnosis not present

## 2020-11-08 DIAGNOSIS — D649 Anemia, unspecified: Secondary | ICD-10-CM | POA: Diagnosis not present

## 2020-11-09 DIAGNOSIS — G309 Alzheimer's disease, unspecified: Secondary | ICD-10-CM | POA: Diagnosis not present

## 2020-11-09 DIAGNOSIS — E785 Hyperlipidemia, unspecified: Secondary | ICD-10-CM | POA: Diagnosis not present

## 2020-11-09 DIAGNOSIS — F0280 Dementia in other diseases classified elsewhere without behavioral disturbance: Secondary | ICD-10-CM | POA: Diagnosis not present

## 2020-11-09 DIAGNOSIS — E46 Unspecified protein-calorie malnutrition: Secondary | ICD-10-CM | POA: Diagnosis not present

## 2020-11-09 DIAGNOSIS — I10 Essential (primary) hypertension: Secondary | ICD-10-CM | POA: Diagnosis not present

## 2020-11-09 DIAGNOSIS — D649 Anemia, unspecified: Secondary | ICD-10-CM | POA: Diagnosis not present

## 2020-11-10 DIAGNOSIS — D649 Anemia, unspecified: Secondary | ICD-10-CM | POA: Diagnosis not present

## 2020-11-10 DIAGNOSIS — I10 Essential (primary) hypertension: Secondary | ICD-10-CM | POA: Diagnosis not present

## 2020-11-10 DIAGNOSIS — E785 Hyperlipidemia, unspecified: Secondary | ICD-10-CM | POA: Diagnosis not present

## 2020-11-10 DIAGNOSIS — F0280 Dementia in other diseases classified elsewhere without behavioral disturbance: Secondary | ICD-10-CM | POA: Diagnosis not present

## 2020-11-10 DIAGNOSIS — E46 Unspecified protein-calorie malnutrition: Secondary | ICD-10-CM | POA: Diagnosis not present

## 2020-11-10 DIAGNOSIS — G309 Alzheimer's disease, unspecified: Secondary | ICD-10-CM | POA: Diagnosis not present

## 2020-11-11 DIAGNOSIS — E46 Unspecified protein-calorie malnutrition: Secondary | ICD-10-CM | POA: Diagnosis not present

## 2020-11-11 DIAGNOSIS — F0280 Dementia in other diseases classified elsewhere without behavioral disturbance: Secondary | ICD-10-CM | POA: Diagnosis not present

## 2020-11-11 DIAGNOSIS — G309 Alzheimer's disease, unspecified: Secondary | ICD-10-CM | POA: Diagnosis not present

## 2020-11-11 DIAGNOSIS — D649 Anemia, unspecified: Secondary | ICD-10-CM | POA: Diagnosis not present

## 2020-11-11 DIAGNOSIS — E785 Hyperlipidemia, unspecified: Secondary | ICD-10-CM | POA: Diagnosis not present

## 2020-11-11 DIAGNOSIS — I10 Essential (primary) hypertension: Secondary | ICD-10-CM | POA: Diagnosis not present

## 2020-11-13 DIAGNOSIS — D649 Anemia, unspecified: Secondary | ICD-10-CM | POA: Diagnosis not present

## 2020-11-13 DIAGNOSIS — G309 Alzheimer's disease, unspecified: Secondary | ICD-10-CM | POA: Diagnosis not present

## 2020-11-13 DIAGNOSIS — E785 Hyperlipidemia, unspecified: Secondary | ICD-10-CM | POA: Diagnosis not present

## 2020-11-13 DIAGNOSIS — E46 Unspecified protein-calorie malnutrition: Secondary | ICD-10-CM | POA: Diagnosis not present

## 2020-11-13 DIAGNOSIS — F0280 Dementia in other diseases classified elsewhere without behavioral disturbance: Secondary | ICD-10-CM | POA: Diagnosis not present

## 2020-11-13 DIAGNOSIS — I10 Essential (primary) hypertension: Secondary | ICD-10-CM | POA: Diagnosis not present

## 2020-11-14 DIAGNOSIS — E46 Unspecified protein-calorie malnutrition: Secondary | ICD-10-CM | POA: Diagnosis not present

## 2020-11-14 DIAGNOSIS — F0280 Dementia in other diseases classified elsewhere without behavioral disturbance: Secondary | ICD-10-CM | POA: Diagnosis not present

## 2020-11-14 DIAGNOSIS — I10 Essential (primary) hypertension: Secondary | ICD-10-CM | POA: Diagnosis not present

## 2020-11-14 DIAGNOSIS — D649 Anemia, unspecified: Secondary | ICD-10-CM | POA: Diagnosis not present

## 2020-11-14 DIAGNOSIS — E785 Hyperlipidemia, unspecified: Secondary | ICD-10-CM | POA: Diagnosis not present

## 2020-11-14 DIAGNOSIS — G309 Alzheimer's disease, unspecified: Secondary | ICD-10-CM | POA: Diagnosis not present

## 2020-11-15 DIAGNOSIS — F0280 Dementia in other diseases classified elsewhere without behavioral disturbance: Secondary | ICD-10-CM | POA: Diagnosis not present

## 2020-11-15 DIAGNOSIS — D649 Anemia, unspecified: Secondary | ICD-10-CM | POA: Diagnosis not present

## 2020-11-15 DIAGNOSIS — M21379 Foot drop, unspecified foot: Secondary | ICD-10-CM | POA: Diagnosis not present

## 2020-11-15 DIAGNOSIS — M24542 Contracture, left hand: Secondary | ICD-10-CM | POA: Diagnosis not present

## 2020-11-15 DIAGNOSIS — L89223 Pressure ulcer of left hip, stage 3: Secondary | ICD-10-CM | POA: Diagnosis not present

## 2020-11-15 DIAGNOSIS — E785 Hyperlipidemia, unspecified: Secondary | ICD-10-CM | POA: Diagnosis not present

## 2020-11-15 DIAGNOSIS — E46 Unspecified protein-calorie malnutrition: Secondary | ICD-10-CM | POA: Diagnosis not present

## 2020-11-15 DIAGNOSIS — R32 Unspecified urinary incontinence: Secondary | ICD-10-CM | POA: Diagnosis not present

## 2020-11-15 DIAGNOSIS — I10 Essential (primary) hypertension: Secondary | ICD-10-CM | POA: Diagnosis not present

## 2020-11-15 DIAGNOSIS — Z7401 Bed confinement status: Secondary | ICD-10-CM | POA: Diagnosis not present

## 2020-11-15 DIAGNOSIS — G309 Alzheimer's disease, unspecified: Secondary | ICD-10-CM | POA: Diagnosis not present

## 2020-11-15 DIAGNOSIS — L89213 Pressure ulcer of right hip, stage 3: Secondary | ICD-10-CM | POA: Diagnosis not present

## 2020-11-15 DIAGNOSIS — Z681 Body mass index (BMI) 19 or less, adult: Secondary | ICD-10-CM | POA: Diagnosis not present

## 2020-11-15 DIAGNOSIS — L89152 Pressure ulcer of sacral region, stage 2: Secondary | ICD-10-CM | POA: Diagnosis not present

## 2020-11-16 DIAGNOSIS — D649 Anemia, unspecified: Secondary | ICD-10-CM | POA: Diagnosis not present

## 2020-11-16 DIAGNOSIS — G309 Alzheimer's disease, unspecified: Secondary | ICD-10-CM | POA: Diagnosis not present

## 2020-11-16 DIAGNOSIS — E46 Unspecified protein-calorie malnutrition: Secondary | ICD-10-CM | POA: Diagnosis not present

## 2020-11-16 DIAGNOSIS — I10 Essential (primary) hypertension: Secondary | ICD-10-CM | POA: Diagnosis not present

## 2020-11-16 DIAGNOSIS — E785 Hyperlipidemia, unspecified: Secondary | ICD-10-CM | POA: Diagnosis not present

## 2020-11-16 DIAGNOSIS — F0280 Dementia in other diseases classified elsewhere without behavioral disturbance: Secondary | ICD-10-CM | POA: Diagnosis not present

## 2020-11-17 DIAGNOSIS — D649 Anemia, unspecified: Secondary | ICD-10-CM | POA: Diagnosis not present

## 2020-11-17 DIAGNOSIS — F0280 Dementia in other diseases classified elsewhere without behavioral disturbance: Secondary | ICD-10-CM | POA: Diagnosis not present

## 2020-11-17 DIAGNOSIS — E46 Unspecified protein-calorie malnutrition: Secondary | ICD-10-CM | POA: Diagnosis not present

## 2020-11-17 DIAGNOSIS — E785 Hyperlipidemia, unspecified: Secondary | ICD-10-CM | POA: Diagnosis not present

## 2020-11-17 DIAGNOSIS — I10 Essential (primary) hypertension: Secondary | ICD-10-CM | POA: Diagnosis not present

## 2020-11-17 DIAGNOSIS — G309 Alzheimer's disease, unspecified: Secondary | ICD-10-CM | POA: Diagnosis not present

## 2020-11-18 DIAGNOSIS — D649 Anemia, unspecified: Secondary | ICD-10-CM | POA: Diagnosis not present

## 2020-11-18 DIAGNOSIS — E785 Hyperlipidemia, unspecified: Secondary | ICD-10-CM | POA: Diagnosis not present

## 2020-11-18 DIAGNOSIS — G309 Alzheimer's disease, unspecified: Secondary | ICD-10-CM | POA: Diagnosis not present

## 2020-11-18 DIAGNOSIS — E46 Unspecified protein-calorie malnutrition: Secondary | ICD-10-CM | POA: Diagnosis not present

## 2020-11-18 DIAGNOSIS — F0280 Dementia in other diseases classified elsewhere without behavioral disturbance: Secondary | ICD-10-CM | POA: Diagnosis not present

## 2020-11-18 DIAGNOSIS — I10 Essential (primary) hypertension: Secondary | ICD-10-CM | POA: Diagnosis not present

## 2020-11-19 DIAGNOSIS — F0280 Dementia in other diseases classified elsewhere without behavioral disturbance: Secondary | ICD-10-CM | POA: Diagnosis not present

## 2020-11-19 DIAGNOSIS — G309 Alzheimer's disease, unspecified: Secondary | ICD-10-CM | POA: Diagnosis not present

## 2020-11-19 DIAGNOSIS — E785 Hyperlipidemia, unspecified: Secondary | ICD-10-CM | POA: Diagnosis not present

## 2020-11-19 DIAGNOSIS — E46 Unspecified protein-calorie malnutrition: Secondary | ICD-10-CM | POA: Diagnosis not present

## 2020-11-19 DIAGNOSIS — D649 Anemia, unspecified: Secondary | ICD-10-CM | POA: Diagnosis not present

## 2020-11-19 DIAGNOSIS — I10 Essential (primary) hypertension: Secondary | ICD-10-CM | POA: Diagnosis not present

## 2020-11-20 DIAGNOSIS — F0280 Dementia in other diseases classified elsewhere without behavioral disturbance: Secondary | ICD-10-CM | POA: Diagnosis not present

## 2020-11-20 DIAGNOSIS — D649 Anemia, unspecified: Secondary | ICD-10-CM | POA: Diagnosis not present

## 2020-11-20 DIAGNOSIS — E785 Hyperlipidemia, unspecified: Secondary | ICD-10-CM | POA: Diagnosis not present

## 2020-11-20 DIAGNOSIS — I10 Essential (primary) hypertension: Secondary | ICD-10-CM | POA: Diagnosis not present

## 2020-11-20 DIAGNOSIS — G309 Alzheimer's disease, unspecified: Secondary | ICD-10-CM | POA: Diagnosis not present

## 2020-11-20 DIAGNOSIS — E46 Unspecified protein-calorie malnutrition: Secondary | ICD-10-CM | POA: Diagnosis not present

## 2020-11-21 DIAGNOSIS — E46 Unspecified protein-calorie malnutrition: Secondary | ICD-10-CM | POA: Diagnosis not present

## 2020-11-21 DIAGNOSIS — F0280 Dementia in other diseases classified elsewhere without behavioral disturbance: Secondary | ICD-10-CM | POA: Diagnosis not present

## 2020-11-21 DIAGNOSIS — E785 Hyperlipidemia, unspecified: Secondary | ICD-10-CM | POA: Diagnosis not present

## 2020-11-21 DIAGNOSIS — G309 Alzheimer's disease, unspecified: Secondary | ICD-10-CM | POA: Diagnosis not present

## 2020-11-21 DIAGNOSIS — I10 Essential (primary) hypertension: Secondary | ICD-10-CM | POA: Diagnosis not present

## 2020-11-21 DIAGNOSIS — D649 Anemia, unspecified: Secondary | ICD-10-CM | POA: Diagnosis not present

## 2020-11-22 DIAGNOSIS — E785 Hyperlipidemia, unspecified: Secondary | ICD-10-CM | POA: Diagnosis not present

## 2020-11-22 DIAGNOSIS — F0280 Dementia in other diseases classified elsewhere without behavioral disturbance: Secondary | ICD-10-CM | POA: Diagnosis not present

## 2020-11-22 DIAGNOSIS — G309 Alzheimer's disease, unspecified: Secondary | ICD-10-CM | POA: Diagnosis not present

## 2020-11-22 DIAGNOSIS — E46 Unspecified protein-calorie malnutrition: Secondary | ICD-10-CM | POA: Diagnosis not present

## 2020-11-22 DIAGNOSIS — D649 Anemia, unspecified: Secondary | ICD-10-CM | POA: Diagnosis not present

## 2020-11-22 DIAGNOSIS — I10 Essential (primary) hypertension: Secondary | ICD-10-CM | POA: Diagnosis not present

## 2020-11-23 DIAGNOSIS — F0280 Dementia in other diseases classified elsewhere without behavioral disturbance: Secondary | ICD-10-CM | POA: Diagnosis not present

## 2020-11-23 DIAGNOSIS — E46 Unspecified protein-calorie malnutrition: Secondary | ICD-10-CM | POA: Diagnosis not present

## 2020-11-23 DIAGNOSIS — G309 Alzheimer's disease, unspecified: Secondary | ICD-10-CM | POA: Diagnosis not present

## 2020-11-23 DIAGNOSIS — D649 Anemia, unspecified: Secondary | ICD-10-CM | POA: Diagnosis not present

## 2020-11-23 DIAGNOSIS — E785 Hyperlipidemia, unspecified: Secondary | ICD-10-CM | POA: Diagnosis not present

## 2020-11-23 DIAGNOSIS — I10 Essential (primary) hypertension: Secondary | ICD-10-CM | POA: Diagnosis not present

## 2020-11-24 DIAGNOSIS — I10 Essential (primary) hypertension: Secondary | ICD-10-CM | POA: Diagnosis not present

## 2020-11-24 DIAGNOSIS — G309 Alzheimer's disease, unspecified: Secondary | ICD-10-CM | POA: Diagnosis not present

## 2020-11-24 DIAGNOSIS — E785 Hyperlipidemia, unspecified: Secondary | ICD-10-CM | POA: Diagnosis not present

## 2020-11-24 DIAGNOSIS — E46 Unspecified protein-calorie malnutrition: Secondary | ICD-10-CM | POA: Diagnosis not present

## 2020-11-24 DIAGNOSIS — F0280 Dementia in other diseases classified elsewhere without behavioral disturbance: Secondary | ICD-10-CM | POA: Diagnosis not present

## 2020-11-24 DIAGNOSIS — D649 Anemia, unspecified: Secondary | ICD-10-CM | POA: Diagnosis not present

## 2020-11-25 ENCOUNTER — Other Ambulatory Visit: Payer: Self-pay | Admitting: Family Medicine

## 2020-11-25 DIAGNOSIS — F0280 Dementia in other diseases classified elsewhere without behavioral disturbance: Secondary | ICD-10-CM | POA: Diagnosis not present

## 2020-11-25 DIAGNOSIS — E46 Unspecified protein-calorie malnutrition: Secondary | ICD-10-CM | POA: Diagnosis not present

## 2020-11-25 DIAGNOSIS — I10 Essential (primary) hypertension: Secondary | ICD-10-CM | POA: Diagnosis not present

## 2020-11-25 DIAGNOSIS — D649 Anemia, unspecified: Secondary | ICD-10-CM | POA: Diagnosis not present

## 2020-11-25 DIAGNOSIS — E1169 Type 2 diabetes mellitus with other specified complication: Secondary | ICD-10-CM

## 2020-11-25 DIAGNOSIS — E785 Hyperlipidemia, unspecified: Secondary | ICD-10-CM | POA: Diagnosis not present

## 2020-11-25 DIAGNOSIS — G309 Alzheimer's disease, unspecified: Secondary | ICD-10-CM | POA: Diagnosis not present

## 2020-11-27 DIAGNOSIS — F0280 Dementia in other diseases classified elsewhere without behavioral disturbance: Secondary | ICD-10-CM | POA: Diagnosis not present

## 2020-11-27 DIAGNOSIS — E46 Unspecified protein-calorie malnutrition: Secondary | ICD-10-CM | POA: Diagnosis not present

## 2020-11-27 DIAGNOSIS — G309 Alzheimer's disease, unspecified: Secondary | ICD-10-CM | POA: Diagnosis not present

## 2020-11-27 DIAGNOSIS — E785 Hyperlipidemia, unspecified: Secondary | ICD-10-CM | POA: Diagnosis not present

## 2020-11-27 DIAGNOSIS — D649 Anemia, unspecified: Secondary | ICD-10-CM | POA: Diagnosis not present

## 2020-11-27 DIAGNOSIS — I10 Essential (primary) hypertension: Secondary | ICD-10-CM | POA: Diagnosis not present

## 2020-11-28 DIAGNOSIS — E46 Unspecified protein-calorie malnutrition: Secondary | ICD-10-CM | POA: Diagnosis not present

## 2020-11-28 DIAGNOSIS — E785 Hyperlipidemia, unspecified: Secondary | ICD-10-CM | POA: Diagnosis not present

## 2020-11-28 DIAGNOSIS — I10 Essential (primary) hypertension: Secondary | ICD-10-CM | POA: Diagnosis not present

## 2020-11-28 DIAGNOSIS — G309 Alzheimer's disease, unspecified: Secondary | ICD-10-CM | POA: Diagnosis not present

## 2020-11-28 DIAGNOSIS — D649 Anemia, unspecified: Secondary | ICD-10-CM | POA: Diagnosis not present

## 2020-11-28 DIAGNOSIS — F0280 Dementia in other diseases classified elsewhere without behavioral disturbance: Secondary | ICD-10-CM | POA: Diagnosis not present

## 2020-11-29 DIAGNOSIS — F0280 Dementia in other diseases classified elsewhere without behavioral disturbance: Secondary | ICD-10-CM | POA: Diagnosis not present

## 2020-11-29 DIAGNOSIS — D649 Anemia, unspecified: Secondary | ICD-10-CM | POA: Diagnosis not present

## 2020-11-29 DIAGNOSIS — I10 Essential (primary) hypertension: Secondary | ICD-10-CM | POA: Diagnosis not present

## 2020-11-29 DIAGNOSIS — E785 Hyperlipidemia, unspecified: Secondary | ICD-10-CM | POA: Diagnosis not present

## 2020-11-29 DIAGNOSIS — E46 Unspecified protein-calorie malnutrition: Secondary | ICD-10-CM | POA: Diagnosis not present

## 2020-11-29 DIAGNOSIS — G309 Alzheimer's disease, unspecified: Secondary | ICD-10-CM | POA: Diagnosis not present

## 2020-11-30 DIAGNOSIS — G309 Alzheimer's disease, unspecified: Secondary | ICD-10-CM | POA: Diagnosis not present

## 2020-11-30 DIAGNOSIS — D649 Anemia, unspecified: Secondary | ICD-10-CM | POA: Diagnosis not present

## 2020-11-30 DIAGNOSIS — I10 Essential (primary) hypertension: Secondary | ICD-10-CM | POA: Diagnosis not present

## 2020-11-30 DIAGNOSIS — E785 Hyperlipidemia, unspecified: Secondary | ICD-10-CM | POA: Diagnosis not present

## 2020-11-30 DIAGNOSIS — F0280 Dementia in other diseases classified elsewhere without behavioral disturbance: Secondary | ICD-10-CM | POA: Diagnosis not present

## 2020-11-30 DIAGNOSIS — E46 Unspecified protein-calorie malnutrition: Secondary | ICD-10-CM | POA: Diagnosis not present

## 2020-12-01 DIAGNOSIS — I10 Essential (primary) hypertension: Secondary | ICD-10-CM | POA: Diagnosis not present

## 2020-12-01 DIAGNOSIS — E785 Hyperlipidemia, unspecified: Secondary | ICD-10-CM | POA: Diagnosis not present

## 2020-12-01 DIAGNOSIS — E46 Unspecified protein-calorie malnutrition: Secondary | ICD-10-CM | POA: Diagnosis not present

## 2020-12-01 DIAGNOSIS — D649 Anemia, unspecified: Secondary | ICD-10-CM | POA: Diagnosis not present

## 2020-12-01 DIAGNOSIS — G309 Alzheimer's disease, unspecified: Secondary | ICD-10-CM | POA: Diagnosis not present

## 2020-12-01 DIAGNOSIS — F0280 Dementia in other diseases classified elsewhere without behavioral disturbance: Secondary | ICD-10-CM | POA: Diagnosis not present

## 2020-12-02 DIAGNOSIS — F0280 Dementia in other diseases classified elsewhere without behavioral disturbance: Secondary | ICD-10-CM | POA: Diagnosis not present

## 2020-12-02 DIAGNOSIS — G309 Alzheimer's disease, unspecified: Secondary | ICD-10-CM | POA: Diagnosis not present

## 2020-12-02 DIAGNOSIS — D649 Anemia, unspecified: Secondary | ICD-10-CM | POA: Diagnosis not present

## 2020-12-02 DIAGNOSIS — E785 Hyperlipidemia, unspecified: Secondary | ICD-10-CM | POA: Diagnosis not present

## 2020-12-02 DIAGNOSIS — E46 Unspecified protein-calorie malnutrition: Secondary | ICD-10-CM | POA: Diagnosis not present

## 2020-12-02 DIAGNOSIS — I10 Essential (primary) hypertension: Secondary | ICD-10-CM | POA: Diagnosis not present

## 2020-12-05 DIAGNOSIS — D649 Anemia, unspecified: Secondary | ICD-10-CM | POA: Diagnosis not present

## 2020-12-05 DIAGNOSIS — E46 Unspecified protein-calorie malnutrition: Secondary | ICD-10-CM | POA: Diagnosis not present

## 2020-12-05 DIAGNOSIS — E785 Hyperlipidemia, unspecified: Secondary | ICD-10-CM | POA: Diagnosis not present

## 2020-12-05 DIAGNOSIS — I10 Essential (primary) hypertension: Secondary | ICD-10-CM | POA: Diagnosis not present

## 2020-12-05 DIAGNOSIS — F0280 Dementia in other diseases classified elsewhere without behavioral disturbance: Secondary | ICD-10-CM | POA: Diagnosis not present

## 2020-12-05 DIAGNOSIS — G309 Alzheimer's disease, unspecified: Secondary | ICD-10-CM | POA: Diagnosis not present

## 2020-12-06 DIAGNOSIS — F0280 Dementia in other diseases classified elsewhere without behavioral disturbance: Secondary | ICD-10-CM | POA: Diagnosis not present

## 2020-12-06 DIAGNOSIS — E46 Unspecified protein-calorie malnutrition: Secondary | ICD-10-CM | POA: Diagnosis not present

## 2020-12-06 DIAGNOSIS — G309 Alzheimer's disease, unspecified: Secondary | ICD-10-CM | POA: Diagnosis not present

## 2020-12-06 DIAGNOSIS — I10 Essential (primary) hypertension: Secondary | ICD-10-CM | POA: Diagnosis not present

## 2020-12-06 DIAGNOSIS — E785 Hyperlipidemia, unspecified: Secondary | ICD-10-CM | POA: Diagnosis not present

## 2020-12-06 DIAGNOSIS — D649 Anemia, unspecified: Secondary | ICD-10-CM | POA: Diagnosis not present

## 2020-12-07 DIAGNOSIS — G309 Alzheimer's disease, unspecified: Secondary | ICD-10-CM | POA: Diagnosis not present

## 2020-12-07 DIAGNOSIS — D649 Anemia, unspecified: Secondary | ICD-10-CM | POA: Diagnosis not present

## 2020-12-07 DIAGNOSIS — F0280 Dementia in other diseases classified elsewhere without behavioral disturbance: Secondary | ICD-10-CM | POA: Diagnosis not present

## 2020-12-07 DIAGNOSIS — E785 Hyperlipidemia, unspecified: Secondary | ICD-10-CM | POA: Diagnosis not present

## 2020-12-07 DIAGNOSIS — E46 Unspecified protein-calorie malnutrition: Secondary | ICD-10-CM | POA: Diagnosis not present

## 2020-12-07 DIAGNOSIS — I10 Essential (primary) hypertension: Secondary | ICD-10-CM | POA: Diagnosis not present

## 2020-12-08 DIAGNOSIS — D649 Anemia, unspecified: Secondary | ICD-10-CM | POA: Diagnosis not present

## 2020-12-08 DIAGNOSIS — I10 Essential (primary) hypertension: Secondary | ICD-10-CM | POA: Diagnosis not present

## 2020-12-08 DIAGNOSIS — E46 Unspecified protein-calorie malnutrition: Secondary | ICD-10-CM | POA: Diagnosis not present

## 2020-12-08 DIAGNOSIS — F0280 Dementia in other diseases classified elsewhere without behavioral disturbance: Secondary | ICD-10-CM | POA: Diagnosis not present

## 2020-12-08 DIAGNOSIS — G309 Alzheimer's disease, unspecified: Secondary | ICD-10-CM | POA: Diagnosis not present

## 2020-12-08 DIAGNOSIS — E785 Hyperlipidemia, unspecified: Secondary | ICD-10-CM | POA: Diagnosis not present

## 2020-12-09 DIAGNOSIS — D649 Anemia, unspecified: Secondary | ICD-10-CM | POA: Diagnosis not present

## 2020-12-09 DIAGNOSIS — E785 Hyperlipidemia, unspecified: Secondary | ICD-10-CM | POA: Diagnosis not present

## 2020-12-09 DIAGNOSIS — F0280 Dementia in other diseases classified elsewhere without behavioral disturbance: Secondary | ICD-10-CM | POA: Diagnosis not present

## 2020-12-09 DIAGNOSIS — I10 Essential (primary) hypertension: Secondary | ICD-10-CM | POA: Diagnosis not present

## 2020-12-09 DIAGNOSIS — E46 Unspecified protein-calorie malnutrition: Secondary | ICD-10-CM | POA: Diagnosis not present

## 2020-12-09 DIAGNOSIS — G309 Alzheimer's disease, unspecified: Secondary | ICD-10-CM | POA: Diagnosis not present

## 2020-12-11 DIAGNOSIS — G309 Alzheimer's disease, unspecified: Secondary | ICD-10-CM | POA: Diagnosis not present

## 2020-12-11 DIAGNOSIS — D649 Anemia, unspecified: Secondary | ICD-10-CM | POA: Diagnosis not present

## 2020-12-11 DIAGNOSIS — F0280 Dementia in other diseases classified elsewhere without behavioral disturbance: Secondary | ICD-10-CM | POA: Diagnosis not present

## 2020-12-11 DIAGNOSIS — I10 Essential (primary) hypertension: Secondary | ICD-10-CM | POA: Diagnosis not present

## 2020-12-11 DIAGNOSIS — E785 Hyperlipidemia, unspecified: Secondary | ICD-10-CM | POA: Diagnosis not present

## 2020-12-11 DIAGNOSIS — E46 Unspecified protein-calorie malnutrition: Secondary | ICD-10-CM | POA: Diagnosis not present

## 2020-12-12 DIAGNOSIS — D649 Anemia, unspecified: Secondary | ICD-10-CM | POA: Diagnosis not present

## 2020-12-12 DIAGNOSIS — F0280 Dementia in other diseases classified elsewhere without behavioral disturbance: Secondary | ICD-10-CM | POA: Diagnosis not present

## 2020-12-12 DIAGNOSIS — G309 Alzheimer's disease, unspecified: Secondary | ICD-10-CM | POA: Diagnosis not present

## 2020-12-12 DIAGNOSIS — E46 Unspecified protein-calorie malnutrition: Secondary | ICD-10-CM | POA: Diagnosis not present

## 2020-12-12 DIAGNOSIS — E785 Hyperlipidemia, unspecified: Secondary | ICD-10-CM | POA: Diagnosis not present

## 2020-12-12 DIAGNOSIS — I10 Essential (primary) hypertension: Secondary | ICD-10-CM | POA: Diagnosis not present

## 2020-12-13 DIAGNOSIS — E785 Hyperlipidemia, unspecified: Secondary | ICD-10-CM | POA: Diagnosis not present

## 2020-12-13 DIAGNOSIS — D649 Anemia, unspecified: Secondary | ICD-10-CM | POA: Diagnosis not present

## 2020-12-13 DIAGNOSIS — I10 Essential (primary) hypertension: Secondary | ICD-10-CM | POA: Diagnosis not present

## 2020-12-13 DIAGNOSIS — G309 Alzheimer's disease, unspecified: Secondary | ICD-10-CM | POA: Diagnosis not present

## 2020-12-13 DIAGNOSIS — F0280 Dementia in other diseases classified elsewhere without behavioral disturbance: Secondary | ICD-10-CM | POA: Diagnosis not present

## 2020-12-13 DIAGNOSIS — E46 Unspecified protein-calorie malnutrition: Secondary | ICD-10-CM | POA: Diagnosis not present

## 2020-12-14 DIAGNOSIS — E46 Unspecified protein-calorie malnutrition: Secondary | ICD-10-CM | POA: Diagnosis not present

## 2020-12-14 DIAGNOSIS — I10 Essential (primary) hypertension: Secondary | ICD-10-CM | POA: Diagnosis not present

## 2020-12-14 DIAGNOSIS — F0280 Dementia in other diseases classified elsewhere without behavioral disturbance: Secondary | ICD-10-CM | POA: Diagnosis not present

## 2020-12-14 DIAGNOSIS — D649 Anemia, unspecified: Secondary | ICD-10-CM | POA: Diagnosis not present

## 2020-12-14 DIAGNOSIS — E785 Hyperlipidemia, unspecified: Secondary | ICD-10-CM | POA: Diagnosis not present

## 2020-12-14 DIAGNOSIS — G309 Alzheimer's disease, unspecified: Secondary | ICD-10-CM | POA: Diagnosis not present

## 2020-12-15 DIAGNOSIS — I10 Essential (primary) hypertension: Secondary | ICD-10-CM | POA: Diagnosis not present

## 2020-12-15 DIAGNOSIS — D649 Anemia, unspecified: Secondary | ICD-10-CM | POA: Diagnosis not present

## 2020-12-15 DIAGNOSIS — E785 Hyperlipidemia, unspecified: Secondary | ICD-10-CM | POA: Diagnosis not present

## 2020-12-15 DIAGNOSIS — F0280 Dementia in other diseases classified elsewhere without behavioral disturbance: Secondary | ICD-10-CM | POA: Diagnosis not present

## 2020-12-15 DIAGNOSIS — G309 Alzheimer's disease, unspecified: Secondary | ICD-10-CM | POA: Diagnosis not present

## 2020-12-15 DIAGNOSIS — E46 Unspecified protein-calorie malnutrition: Secondary | ICD-10-CM | POA: Diagnosis not present

## 2020-12-16 DIAGNOSIS — E785 Hyperlipidemia, unspecified: Secondary | ICD-10-CM | POA: Diagnosis not present

## 2020-12-16 DIAGNOSIS — E46 Unspecified protein-calorie malnutrition: Secondary | ICD-10-CM | POA: Diagnosis not present

## 2020-12-16 DIAGNOSIS — R32 Unspecified urinary incontinence: Secondary | ICD-10-CM | POA: Diagnosis not present

## 2020-12-16 DIAGNOSIS — M21379 Foot drop, unspecified foot: Secondary | ICD-10-CM | POA: Diagnosis not present

## 2020-12-16 DIAGNOSIS — F0280 Dementia in other diseases classified elsewhere without behavioral disturbance: Secondary | ICD-10-CM | POA: Diagnosis not present

## 2020-12-16 DIAGNOSIS — D649 Anemia, unspecified: Secondary | ICD-10-CM | POA: Diagnosis not present

## 2020-12-16 DIAGNOSIS — M24542 Contracture, left hand: Secondary | ICD-10-CM | POA: Diagnosis not present

## 2020-12-16 DIAGNOSIS — Z7401 Bed confinement status: Secondary | ICD-10-CM | POA: Diagnosis not present

## 2020-12-16 DIAGNOSIS — L89213 Pressure ulcer of right hip, stage 3: Secondary | ICD-10-CM | POA: Diagnosis not present

## 2020-12-16 DIAGNOSIS — L89152 Pressure ulcer of sacral region, stage 2: Secondary | ICD-10-CM | POA: Diagnosis not present

## 2020-12-16 DIAGNOSIS — Z681 Body mass index (BMI) 19 or less, adult: Secondary | ICD-10-CM | POA: Diagnosis not present

## 2020-12-16 DIAGNOSIS — L89223 Pressure ulcer of left hip, stage 3: Secondary | ICD-10-CM | POA: Diagnosis not present

## 2020-12-16 DIAGNOSIS — I10 Essential (primary) hypertension: Secondary | ICD-10-CM | POA: Diagnosis not present

## 2020-12-16 DIAGNOSIS — G309 Alzheimer's disease, unspecified: Secondary | ICD-10-CM | POA: Diagnosis not present

## 2020-12-18 DIAGNOSIS — E785 Hyperlipidemia, unspecified: Secondary | ICD-10-CM | POA: Diagnosis not present

## 2020-12-18 DIAGNOSIS — E46 Unspecified protein-calorie malnutrition: Secondary | ICD-10-CM | POA: Diagnosis not present

## 2020-12-18 DIAGNOSIS — G309 Alzheimer's disease, unspecified: Secondary | ICD-10-CM | POA: Diagnosis not present

## 2020-12-18 DIAGNOSIS — D649 Anemia, unspecified: Secondary | ICD-10-CM | POA: Diagnosis not present

## 2020-12-18 DIAGNOSIS — I10 Essential (primary) hypertension: Secondary | ICD-10-CM | POA: Diagnosis not present

## 2020-12-18 DIAGNOSIS — F0280 Dementia in other diseases classified elsewhere without behavioral disturbance: Secondary | ICD-10-CM | POA: Diagnosis not present

## 2020-12-19 DIAGNOSIS — I10 Essential (primary) hypertension: Secondary | ICD-10-CM | POA: Diagnosis not present

## 2020-12-19 DIAGNOSIS — F0280 Dementia in other diseases classified elsewhere without behavioral disturbance: Secondary | ICD-10-CM | POA: Diagnosis not present

## 2020-12-19 DIAGNOSIS — D649 Anemia, unspecified: Secondary | ICD-10-CM | POA: Diagnosis not present

## 2020-12-19 DIAGNOSIS — E46 Unspecified protein-calorie malnutrition: Secondary | ICD-10-CM | POA: Diagnosis not present

## 2020-12-19 DIAGNOSIS — E785 Hyperlipidemia, unspecified: Secondary | ICD-10-CM | POA: Diagnosis not present

## 2020-12-19 DIAGNOSIS — G309 Alzheimer's disease, unspecified: Secondary | ICD-10-CM | POA: Diagnosis not present

## 2020-12-20 DIAGNOSIS — D649 Anemia, unspecified: Secondary | ICD-10-CM | POA: Diagnosis not present

## 2020-12-20 DIAGNOSIS — F0280 Dementia in other diseases classified elsewhere without behavioral disturbance: Secondary | ICD-10-CM | POA: Diagnosis not present

## 2020-12-20 DIAGNOSIS — G309 Alzheimer's disease, unspecified: Secondary | ICD-10-CM | POA: Diagnosis not present

## 2020-12-20 DIAGNOSIS — E785 Hyperlipidemia, unspecified: Secondary | ICD-10-CM | POA: Diagnosis not present

## 2020-12-20 DIAGNOSIS — I10 Essential (primary) hypertension: Secondary | ICD-10-CM | POA: Diagnosis not present

## 2020-12-20 DIAGNOSIS — E46 Unspecified protein-calorie malnutrition: Secondary | ICD-10-CM | POA: Diagnosis not present

## 2020-12-21 DIAGNOSIS — D649 Anemia, unspecified: Secondary | ICD-10-CM | POA: Diagnosis not present

## 2020-12-21 DIAGNOSIS — E46 Unspecified protein-calorie malnutrition: Secondary | ICD-10-CM | POA: Diagnosis not present

## 2020-12-21 DIAGNOSIS — I10 Essential (primary) hypertension: Secondary | ICD-10-CM | POA: Diagnosis not present

## 2020-12-21 DIAGNOSIS — F0280 Dementia in other diseases classified elsewhere without behavioral disturbance: Secondary | ICD-10-CM | POA: Diagnosis not present

## 2020-12-21 DIAGNOSIS — E785 Hyperlipidemia, unspecified: Secondary | ICD-10-CM | POA: Diagnosis not present

## 2020-12-21 DIAGNOSIS — G309 Alzheimer's disease, unspecified: Secondary | ICD-10-CM | POA: Diagnosis not present

## 2020-12-22 DIAGNOSIS — I10 Essential (primary) hypertension: Secondary | ICD-10-CM | POA: Diagnosis not present

## 2020-12-22 DIAGNOSIS — E785 Hyperlipidemia, unspecified: Secondary | ICD-10-CM | POA: Diagnosis not present

## 2020-12-22 DIAGNOSIS — F0280 Dementia in other diseases classified elsewhere without behavioral disturbance: Secondary | ICD-10-CM | POA: Diagnosis not present

## 2020-12-22 DIAGNOSIS — D649 Anemia, unspecified: Secondary | ICD-10-CM | POA: Diagnosis not present

## 2020-12-22 DIAGNOSIS — G309 Alzheimer's disease, unspecified: Secondary | ICD-10-CM | POA: Diagnosis not present

## 2020-12-22 DIAGNOSIS — E46 Unspecified protein-calorie malnutrition: Secondary | ICD-10-CM | POA: Diagnosis not present

## 2020-12-23 DIAGNOSIS — E46 Unspecified protein-calorie malnutrition: Secondary | ICD-10-CM | POA: Diagnosis not present

## 2020-12-23 DIAGNOSIS — F0280 Dementia in other diseases classified elsewhere without behavioral disturbance: Secondary | ICD-10-CM | POA: Diagnosis not present

## 2020-12-23 DIAGNOSIS — E785 Hyperlipidemia, unspecified: Secondary | ICD-10-CM | POA: Diagnosis not present

## 2020-12-23 DIAGNOSIS — G309 Alzheimer's disease, unspecified: Secondary | ICD-10-CM | POA: Diagnosis not present

## 2020-12-23 DIAGNOSIS — D649 Anemia, unspecified: Secondary | ICD-10-CM | POA: Diagnosis not present

## 2020-12-23 DIAGNOSIS — I10 Essential (primary) hypertension: Secondary | ICD-10-CM | POA: Diagnosis not present

## 2020-12-25 DIAGNOSIS — E46 Unspecified protein-calorie malnutrition: Secondary | ICD-10-CM | POA: Diagnosis not present

## 2020-12-25 DIAGNOSIS — I10 Essential (primary) hypertension: Secondary | ICD-10-CM | POA: Diagnosis not present

## 2020-12-25 DIAGNOSIS — G309 Alzheimer's disease, unspecified: Secondary | ICD-10-CM | POA: Diagnosis not present

## 2020-12-25 DIAGNOSIS — F0280 Dementia in other diseases classified elsewhere without behavioral disturbance: Secondary | ICD-10-CM | POA: Diagnosis not present

## 2020-12-25 DIAGNOSIS — E785 Hyperlipidemia, unspecified: Secondary | ICD-10-CM | POA: Diagnosis not present

## 2020-12-25 DIAGNOSIS — D649 Anemia, unspecified: Secondary | ICD-10-CM | POA: Diagnosis not present

## 2020-12-26 DIAGNOSIS — F0280 Dementia in other diseases classified elsewhere without behavioral disturbance: Secondary | ICD-10-CM | POA: Diagnosis not present

## 2020-12-26 DIAGNOSIS — E785 Hyperlipidemia, unspecified: Secondary | ICD-10-CM | POA: Diagnosis not present

## 2020-12-26 DIAGNOSIS — D649 Anemia, unspecified: Secondary | ICD-10-CM | POA: Diagnosis not present

## 2020-12-26 DIAGNOSIS — I10 Essential (primary) hypertension: Secondary | ICD-10-CM | POA: Diagnosis not present

## 2020-12-26 DIAGNOSIS — E46 Unspecified protein-calorie malnutrition: Secondary | ICD-10-CM | POA: Diagnosis not present

## 2020-12-26 DIAGNOSIS — G309 Alzheimer's disease, unspecified: Secondary | ICD-10-CM | POA: Diagnosis not present

## 2020-12-27 DIAGNOSIS — I10 Essential (primary) hypertension: Secondary | ICD-10-CM | POA: Diagnosis not present

## 2020-12-27 DIAGNOSIS — E46 Unspecified protein-calorie malnutrition: Secondary | ICD-10-CM | POA: Diagnosis not present

## 2020-12-27 DIAGNOSIS — F0280 Dementia in other diseases classified elsewhere without behavioral disturbance: Secondary | ICD-10-CM | POA: Diagnosis not present

## 2020-12-27 DIAGNOSIS — E785 Hyperlipidemia, unspecified: Secondary | ICD-10-CM | POA: Diagnosis not present

## 2020-12-27 DIAGNOSIS — D649 Anemia, unspecified: Secondary | ICD-10-CM | POA: Diagnosis not present

## 2020-12-27 DIAGNOSIS — G309 Alzheimer's disease, unspecified: Secondary | ICD-10-CM | POA: Diagnosis not present

## 2020-12-28 DIAGNOSIS — D649 Anemia, unspecified: Secondary | ICD-10-CM | POA: Diagnosis not present

## 2020-12-28 DIAGNOSIS — E46 Unspecified protein-calorie malnutrition: Secondary | ICD-10-CM | POA: Diagnosis not present

## 2020-12-28 DIAGNOSIS — E785 Hyperlipidemia, unspecified: Secondary | ICD-10-CM | POA: Diagnosis not present

## 2020-12-28 DIAGNOSIS — F0280 Dementia in other diseases classified elsewhere without behavioral disturbance: Secondary | ICD-10-CM | POA: Diagnosis not present

## 2020-12-28 DIAGNOSIS — I10 Essential (primary) hypertension: Secondary | ICD-10-CM | POA: Diagnosis not present

## 2020-12-28 DIAGNOSIS — G309 Alzheimer's disease, unspecified: Secondary | ICD-10-CM | POA: Diagnosis not present

## 2020-12-29 DIAGNOSIS — F0280 Dementia in other diseases classified elsewhere without behavioral disturbance: Secondary | ICD-10-CM | POA: Diagnosis not present

## 2020-12-29 DIAGNOSIS — E785 Hyperlipidemia, unspecified: Secondary | ICD-10-CM | POA: Diagnosis not present

## 2020-12-29 DIAGNOSIS — G309 Alzheimer's disease, unspecified: Secondary | ICD-10-CM | POA: Diagnosis not present

## 2020-12-29 DIAGNOSIS — D649 Anemia, unspecified: Secondary | ICD-10-CM | POA: Diagnosis not present

## 2020-12-29 DIAGNOSIS — E46 Unspecified protein-calorie malnutrition: Secondary | ICD-10-CM | POA: Diagnosis not present

## 2020-12-29 DIAGNOSIS — I10 Essential (primary) hypertension: Secondary | ICD-10-CM | POA: Diagnosis not present

## 2020-12-30 DIAGNOSIS — I10 Essential (primary) hypertension: Secondary | ICD-10-CM | POA: Diagnosis not present

## 2020-12-30 DIAGNOSIS — D649 Anemia, unspecified: Secondary | ICD-10-CM | POA: Diagnosis not present

## 2020-12-30 DIAGNOSIS — E785 Hyperlipidemia, unspecified: Secondary | ICD-10-CM | POA: Diagnosis not present

## 2020-12-30 DIAGNOSIS — E46 Unspecified protein-calorie malnutrition: Secondary | ICD-10-CM | POA: Diagnosis not present

## 2020-12-30 DIAGNOSIS — F0280 Dementia in other diseases classified elsewhere without behavioral disturbance: Secondary | ICD-10-CM | POA: Diagnosis not present

## 2020-12-30 DIAGNOSIS — G309 Alzheimer's disease, unspecified: Secondary | ICD-10-CM | POA: Diagnosis not present

## 2021-01-01 DIAGNOSIS — G309 Alzheimer's disease, unspecified: Secondary | ICD-10-CM | POA: Diagnosis not present

## 2021-01-01 DIAGNOSIS — E785 Hyperlipidemia, unspecified: Secondary | ICD-10-CM | POA: Diagnosis not present

## 2021-01-01 DIAGNOSIS — F0280 Dementia in other diseases classified elsewhere without behavioral disturbance: Secondary | ICD-10-CM | POA: Diagnosis not present

## 2021-01-01 DIAGNOSIS — I10 Essential (primary) hypertension: Secondary | ICD-10-CM | POA: Diagnosis not present

## 2021-01-01 DIAGNOSIS — E46 Unspecified protein-calorie malnutrition: Secondary | ICD-10-CM | POA: Diagnosis not present

## 2021-01-01 DIAGNOSIS — D649 Anemia, unspecified: Secondary | ICD-10-CM | POA: Diagnosis not present

## 2021-01-02 DIAGNOSIS — E785 Hyperlipidemia, unspecified: Secondary | ICD-10-CM | POA: Diagnosis not present

## 2021-01-02 DIAGNOSIS — D649 Anemia, unspecified: Secondary | ICD-10-CM | POA: Diagnosis not present

## 2021-01-02 DIAGNOSIS — I10 Essential (primary) hypertension: Secondary | ICD-10-CM | POA: Diagnosis not present

## 2021-01-02 DIAGNOSIS — F0280 Dementia in other diseases classified elsewhere without behavioral disturbance: Secondary | ICD-10-CM | POA: Diagnosis not present

## 2021-01-02 DIAGNOSIS — G309 Alzheimer's disease, unspecified: Secondary | ICD-10-CM | POA: Diagnosis not present

## 2021-01-02 DIAGNOSIS — E46 Unspecified protein-calorie malnutrition: Secondary | ICD-10-CM | POA: Diagnosis not present

## 2021-01-03 DIAGNOSIS — E46 Unspecified protein-calorie malnutrition: Secondary | ICD-10-CM | POA: Diagnosis not present

## 2021-01-03 DIAGNOSIS — E785 Hyperlipidemia, unspecified: Secondary | ICD-10-CM | POA: Diagnosis not present

## 2021-01-03 DIAGNOSIS — F0280 Dementia in other diseases classified elsewhere without behavioral disturbance: Secondary | ICD-10-CM | POA: Diagnosis not present

## 2021-01-03 DIAGNOSIS — I10 Essential (primary) hypertension: Secondary | ICD-10-CM | POA: Diagnosis not present

## 2021-01-03 DIAGNOSIS — D649 Anemia, unspecified: Secondary | ICD-10-CM | POA: Diagnosis not present

## 2021-01-03 DIAGNOSIS — G309 Alzheimer's disease, unspecified: Secondary | ICD-10-CM | POA: Diagnosis not present

## 2021-01-04 DIAGNOSIS — I10 Essential (primary) hypertension: Secondary | ICD-10-CM | POA: Diagnosis not present

## 2021-01-04 DIAGNOSIS — G309 Alzheimer's disease, unspecified: Secondary | ICD-10-CM | POA: Diagnosis not present

## 2021-01-04 DIAGNOSIS — E785 Hyperlipidemia, unspecified: Secondary | ICD-10-CM | POA: Diagnosis not present

## 2021-01-04 DIAGNOSIS — D649 Anemia, unspecified: Secondary | ICD-10-CM | POA: Diagnosis not present

## 2021-01-04 DIAGNOSIS — F0280 Dementia in other diseases classified elsewhere without behavioral disturbance: Secondary | ICD-10-CM | POA: Diagnosis not present

## 2021-01-04 DIAGNOSIS — E46 Unspecified protein-calorie malnutrition: Secondary | ICD-10-CM | POA: Diagnosis not present

## 2021-01-05 DIAGNOSIS — F0280 Dementia in other diseases classified elsewhere without behavioral disturbance: Secondary | ICD-10-CM | POA: Diagnosis not present

## 2021-01-05 DIAGNOSIS — E785 Hyperlipidemia, unspecified: Secondary | ICD-10-CM | POA: Diagnosis not present

## 2021-01-05 DIAGNOSIS — I10 Essential (primary) hypertension: Secondary | ICD-10-CM | POA: Diagnosis not present

## 2021-01-05 DIAGNOSIS — D649 Anemia, unspecified: Secondary | ICD-10-CM | POA: Diagnosis not present

## 2021-01-05 DIAGNOSIS — G309 Alzheimer's disease, unspecified: Secondary | ICD-10-CM | POA: Diagnosis not present

## 2021-01-05 DIAGNOSIS — E46 Unspecified protein-calorie malnutrition: Secondary | ICD-10-CM | POA: Diagnosis not present

## 2021-01-06 DIAGNOSIS — D649 Anemia, unspecified: Secondary | ICD-10-CM | POA: Diagnosis not present

## 2021-01-06 DIAGNOSIS — F0280 Dementia in other diseases classified elsewhere without behavioral disturbance: Secondary | ICD-10-CM | POA: Diagnosis not present

## 2021-01-06 DIAGNOSIS — E46 Unspecified protein-calorie malnutrition: Secondary | ICD-10-CM | POA: Diagnosis not present

## 2021-01-06 DIAGNOSIS — G309 Alzheimer's disease, unspecified: Secondary | ICD-10-CM | POA: Diagnosis not present

## 2021-01-06 DIAGNOSIS — E785 Hyperlipidemia, unspecified: Secondary | ICD-10-CM | POA: Diagnosis not present

## 2021-01-06 DIAGNOSIS — I10 Essential (primary) hypertension: Secondary | ICD-10-CM | POA: Diagnosis not present

## 2021-01-07 DIAGNOSIS — D649 Anemia, unspecified: Secondary | ICD-10-CM | POA: Diagnosis not present

## 2021-01-07 DIAGNOSIS — I10 Essential (primary) hypertension: Secondary | ICD-10-CM | POA: Diagnosis not present

## 2021-01-07 DIAGNOSIS — E46 Unspecified protein-calorie malnutrition: Secondary | ICD-10-CM | POA: Diagnosis not present

## 2021-01-07 DIAGNOSIS — G309 Alzheimer's disease, unspecified: Secondary | ICD-10-CM | POA: Diagnosis not present

## 2021-01-07 DIAGNOSIS — E785 Hyperlipidemia, unspecified: Secondary | ICD-10-CM | POA: Diagnosis not present

## 2021-01-07 DIAGNOSIS — F0280 Dementia in other diseases classified elsewhere without behavioral disturbance: Secondary | ICD-10-CM | POA: Diagnosis not present

## 2021-01-08 DIAGNOSIS — E46 Unspecified protein-calorie malnutrition: Secondary | ICD-10-CM | POA: Diagnosis not present

## 2021-01-08 DIAGNOSIS — G309 Alzheimer's disease, unspecified: Secondary | ICD-10-CM | POA: Diagnosis not present

## 2021-01-08 DIAGNOSIS — F0280 Dementia in other diseases classified elsewhere without behavioral disturbance: Secondary | ICD-10-CM | POA: Diagnosis not present

## 2021-01-08 DIAGNOSIS — I10 Essential (primary) hypertension: Secondary | ICD-10-CM | POA: Diagnosis not present

## 2021-01-08 DIAGNOSIS — D649 Anemia, unspecified: Secondary | ICD-10-CM | POA: Diagnosis not present

## 2021-01-08 DIAGNOSIS — E785 Hyperlipidemia, unspecified: Secondary | ICD-10-CM | POA: Diagnosis not present

## 2021-01-09 DIAGNOSIS — G309 Alzheimer's disease, unspecified: Secondary | ICD-10-CM | POA: Diagnosis not present

## 2021-01-09 DIAGNOSIS — E785 Hyperlipidemia, unspecified: Secondary | ICD-10-CM | POA: Diagnosis not present

## 2021-01-09 DIAGNOSIS — E46 Unspecified protein-calorie malnutrition: Secondary | ICD-10-CM | POA: Diagnosis not present

## 2021-01-09 DIAGNOSIS — D649 Anemia, unspecified: Secondary | ICD-10-CM | POA: Diagnosis not present

## 2021-01-09 DIAGNOSIS — F0280 Dementia in other diseases classified elsewhere without behavioral disturbance: Secondary | ICD-10-CM | POA: Diagnosis not present

## 2021-01-09 DIAGNOSIS — I10 Essential (primary) hypertension: Secondary | ICD-10-CM | POA: Diagnosis not present

## 2021-01-10 DIAGNOSIS — E46 Unspecified protein-calorie malnutrition: Secondary | ICD-10-CM | POA: Diagnosis not present

## 2021-01-10 DIAGNOSIS — F0280 Dementia in other diseases classified elsewhere without behavioral disturbance: Secondary | ICD-10-CM | POA: Diagnosis not present

## 2021-01-10 DIAGNOSIS — D649 Anemia, unspecified: Secondary | ICD-10-CM | POA: Diagnosis not present

## 2021-01-10 DIAGNOSIS — G309 Alzheimer's disease, unspecified: Secondary | ICD-10-CM | POA: Diagnosis not present

## 2021-01-10 DIAGNOSIS — I10 Essential (primary) hypertension: Secondary | ICD-10-CM | POA: Diagnosis not present

## 2021-01-10 DIAGNOSIS — E785 Hyperlipidemia, unspecified: Secondary | ICD-10-CM | POA: Diagnosis not present

## 2021-01-11 DIAGNOSIS — F0280 Dementia in other diseases classified elsewhere without behavioral disturbance: Secondary | ICD-10-CM | POA: Diagnosis not present

## 2021-01-11 DIAGNOSIS — G309 Alzheimer's disease, unspecified: Secondary | ICD-10-CM | POA: Diagnosis not present

## 2021-01-11 DIAGNOSIS — E785 Hyperlipidemia, unspecified: Secondary | ICD-10-CM | POA: Diagnosis not present

## 2021-01-11 DIAGNOSIS — E46 Unspecified protein-calorie malnutrition: Secondary | ICD-10-CM | POA: Diagnosis not present

## 2021-01-11 DIAGNOSIS — D649 Anemia, unspecified: Secondary | ICD-10-CM | POA: Diagnosis not present

## 2021-01-11 DIAGNOSIS — I10 Essential (primary) hypertension: Secondary | ICD-10-CM | POA: Diagnosis not present

## 2021-01-12 DIAGNOSIS — G309 Alzheimer's disease, unspecified: Secondary | ICD-10-CM | POA: Diagnosis not present

## 2021-01-12 DIAGNOSIS — I10 Essential (primary) hypertension: Secondary | ICD-10-CM | POA: Diagnosis not present

## 2021-01-12 DIAGNOSIS — E46 Unspecified protein-calorie malnutrition: Secondary | ICD-10-CM | POA: Diagnosis not present

## 2021-01-12 DIAGNOSIS — F0280 Dementia in other diseases classified elsewhere without behavioral disturbance: Secondary | ICD-10-CM | POA: Diagnosis not present

## 2021-01-12 DIAGNOSIS — E785 Hyperlipidemia, unspecified: Secondary | ICD-10-CM | POA: Diagnosis not present

## 2021-01-12 DIAGNOSIS — D649 Anemia, unspecified: Secondary | ICD-10-CM | POA: Diagnosis not present

## 2021-01-13 DIAGNOSIS — F0280 Dementia in other diseases classified elsewhere without behavioral disturbance: Secondary | ICD-10-CM | POA: Diagnosis not present

## 2021-01-13 DIAGNOSIS — I10 Essential (primary) hypertension: Secondary | ICD-10-CM | POA: Diagnosis not present

## 2021-01-13 DIAGNOSIS — E785 Hyperlipidemia, unspecified: Secondary | ICD-10-CM | POA: Diagnosis not present

## 2021-01-13 DIAGNOSIS — D649 Anemia, unspecified: Secondary | ICD-10-CM | POA: Diagnosis not present

## 2021-01-13 DIAGNOSIS — G309 Alzheimer's disease, unspecified: Secondary | ICD-10-CM | POA: Diagnosis not present

## 2021-01-13 DIAGNOSIS — E46 Unspecified protein-calorie malnutrition: Secondary | ICD-10-CM | POA: Diagnosis not present

## 2021-01-14 DIAGNOSIS — G309 Alzheimer's disease, unspecified: Secondary | ICD-10-CM | POA: Diagnosis not present

## 2021-01-14 DIAGNOSIS — E46 Unspecified protein-calorie malnutrition: Secondary | ICD-10-CM | POA: Diagnosis not present

## 2021-01-14 DIAGNOSIS — D649 Anemia, unspecified: Secondary | ICD-10-CM | POA: Diagnosis not present

## 2021-01-14 DIAGNOSIS — F0280 Dementia in other diseases classified elsewhere without behavioral disturbance: Secondary | ICD-10-CM | POA: Diagnosis not present

## 2021-01-14 DIAGNOSIS — I10 Essential (primary) hypertension: Secondary | ICD-10-CM | POA: Diagnosis not present

## 2021-01-14 DIAGNOSIS — E785 Hyperlipidemia, unspecified: Secondary | ICD-10-CM | POA: Diagnosis not present

## 2021-01-15 DEATH — deceased

## 2021-01-16 ENCOUNTER — Telehealth: Payer: Self-pay | Admitting: Family Medicine

## 2021-01-16 NOTE — Telephone Encounter (Signed)
[employer] [employer] called and they sent pts death certificate to you electronically and needs it signed and sent back as soon as possible.
